# Patient Record
Sex: Female | Born: 1995 | Race: Black or African American | Hispanic: No | Marital: Single | Smoking: Former smoker
Health system: Southern US, Community
[De-identification: ages and names within clinical notes are randomized; demographics above are authoritative.]

## PROBLEM LIST (undated history)

## (undated) ENCOUNTER — Inpatient Hospital Stay: Payer: Self-pay

## (undated) DIAGNOSIS — Z91410 Personal history of adult physical and sexual abuse: Secondary | ICD-10-CM

## (undated) DIAGNOSIS — N83209 Unspecified ovarian cyst, unspecified side: Secondary | ICD-10-CM

## (undated) DIAGNOSIS — N390 Urinary tract infection, site not specified: Secondary | ICD-10-CM

## (undated) DIAGNOSIS — N200 Calculus of kidney: Secondary | ICD-10-CM

## (undated) DIAGNOSIS — O99810 Abnormal glucose complicating pregnancy: Secondary | ICD-10-CM

## (undated) DIAGNOSIS — O99019 Anemia complicating pregnancy, unspecified trimester: Secondary | ICD-10-CM

## (undated) DIAGNOSIS — K219 Gastro-esophageal reflux disease without esophagitis: Secondary | ICD-10-CM

## (undated) HISTORY — PX: OTHER SURGICAL HISTORY: SHX169

## (undated) HISTORY — DX: Unspecified ovarian cyst, unspecified side: N83.209

## (undated) HISTORY — DX: Gastro-esophageal reflux disease without esophagitis: K21.9

## (undated) HISTORY — DX: Calculus of kidney: N20.0

---
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
## 2016-02-05 DIAGNOSIS — Z91410 Personal history of adult physical and sexual abuse: Secondary | ICD-10-CM

## 2016-02-05 HISTORY — DX: Personal history of adult physical and sexual abuse: Z91.410

## 2016-04-29 ENCOUNTER — Emergency Department
Admission: EM | Admit: 2016-04-29 | Discharge: 2016-04-29 | Disposition: A | Discharge status: Home / Self Care | Payer: Medicaid Other | Attending: Emergency Medicine | Admitting: Emergency Medicine

## 2016-04-29 ENCOUNTER — Encounter: Payer: Self-pay | Admitting: Emergency Medicine

## 2016-04-29 DIAGNOSIS — F17210 Nicotine dependence, cigarettes, uncomplicated: Secondary | ICD-10-CM | POA: Insufficient documentation

## 2016-04-29 DIAGNOSIS — N926 Irregular menstruation, unspecified: Secondary | ICD-10-CM | POA: Diagnosis not present

## 2016-04-29 DIAGNOSIS — R21 Rash and other nonspecific skin eruption: Secondary | ICD-10-CM | POA: Diagnosis not present

## 2016-04-29 LAB — POCT PREGNANCY, URINE: Preg Test, Ur: NEGATIVE

## 2016-04-29 MED ORDER — HYDROCORTISONE 2.5 % EX OINT: TOPICAL_OINTMENT | Freq: Two times a day (BID) | CUTANEOUS | 0 refills | Status: DC

## 2016-04-29 MED ORDER — DIPHENHYDRAMINE HCL 25 MG PO CAPS: 25.0000 mg | ORAL_CAPSULE | ORAL | 0 refills | Status: DC | PRN

## 2016-04-29 NOTE — ED Triage Notes (Addendum)
Pt in via POV with complaints of rash to bilateral upper extremeties and chest x 2 weeks.  Pt denies any pain, reports itching.  Pt also would like to find out whether or not she is pregnant, states last period approximately one and half months ago.

## 2016-04-29 NOTE — ED Provider Notes (Signed)
[facility] Emergency Department Provider Note  ____________________________________________  Time seen: Approximately 4:29 PM  I have reviewed the triage vital signs and the nursing notes.   HISTORY  Chief Complaint Rash   HPI Christie Green is a 21 y.o. female who presents to the emergency department for evaluation of rash and possible pregnancy. Rash has been present for the past 2 weeks. She states it started on her chest and is now on her arms. No new contacts/personal hygiene products that she can think of. No one else with similar symptoms. She has not taken any medications for her symptoms. Her last menstrual cycle was about 6 weeks ago.  Past Medical History:  Diagnosis Date  . UTI (urinary tract infection) 09/2015    Patient Active Problem List   Diagnosis Date Noted  . Normal labor 11/30/2015  . Braxton Hick's contraction 11/29/2015  . Abdominal pain affecting pregnancy 11/29/2015  . Irregular contractions 11/15/2015  . UTI in pregnancy, antepartum, third trimester 11/05/2015  . Preterm uterine contractions 11/05/2015  . Pregnancy 10/30/2015    History reviewed. No pertinent surgical history.  Prior to Admission medications   Medication Sig Start Date End Date Taking? Authorizing Provider  diphenhydrAMINE (BENADRYL) 25 mg capsule Take 1 capsule (25 mg total) by mouth every 4 (four) hours as needed. 04/29/16 04/29/17  Chinita Pester, FNP  HYDROcodone-acetaminophen (NORCO) 5-325 MG tablet Take 1 tablet by mouth every 6 (six) hours as needed for moderate pain. 12/02/15   Conard Novak, MD  hydrocortisone 2.5 % ointment Apply topically 2 (two) times daily. 04/29/16   Chinita Pester, FNP  ibuprofen (ADVIL,MOTRIN) 600 MG tablet Take 1 tablet (600 mg total) by mouth every 6 (six) hours. 12/02/15   Conard Novak, MD  Prenatal Vit-Fe Fumarate-FA (PRENATAL VITAMIN PLUS LOW IRON) 27-1 MG TABS Take 1 tablet by mouth every morning. 05/17/15   Emily Filbert, MD    Allergies Patient has no known allergies.  No family history on file.  Social History Social History  Substance Use Topics  . Smoking status: Current Every Day Smoker    Packs/day: 1.00    Years: 4.00    Types: Cigarettes  . Smokeless tobacco: Never Used  . Alcohol use 8.4 oz/week    14 Cans of beer per week    Review of Systems  Constitutional: Negative for fever/chills Respiratory: Negative for shortness of breath. Musculoskeletal: Negative for pain. Skin: Positive for rash. Neurological: Negative for headaches, focal weakness or numbness. ____________________________________________   PHYSICAL EXAM:  VITAL SIGNS: ED Triage Vitals [04/29/16 1533]  Enc Vitals Group     BP (!) 140/45     Pulse Rate 80     Resp 16     Temp 98.2 F (36.8 C)     Temp Source Oral     SpO2 100 %     Weight 200 lb (90.7 kg)     Height  (1.626 m)     Head Circumference      Peak Flow      Pain Score      Pain Loc      Pain Edu?      Excl. in GC?      Constitutional: Alert and oriented. Well appearing and in no acute distress. Eyes: Conjunctivae are normal. EOMI. Nose: No congestion/rhinnorhea. Mouth/Throat: Mucous membranes are moist.   Neck: No stridor. Lymphatic: No cervical lymphadenopathy. Cardiovascular: Good peripheral circulation. Respiratory: Normal respiratory effort.  No  retractions. Lungs clear to auscultation. Musculoskeletal: FROM throughout. Neurologic:  Normal speech and language. No gross focal neurologic deficits are appreciated. Skin: Fine raised maculopapular rash on chest wall and bilateral forearms.  ____________________________________________   LABS (all labs ordered are listed, but only abnormal results are displayed)  Labs Reviewed  POC URINE PREG, ED  POCT PREGNANCY, URINE   ____________________________________________  EKG  Not indicated. ____________________________________________  RADIOLOGY  Not  indicated. ____________________________________________   PROCEDURES  Procedure(s) performed: None ____________________________________________   INITIAL IMPRESSION / ASSESSMENT AND PLAN / ED COURSE  21 year old female who presents to the emergency department for evaluation of rash and pregnancy test. She will be given a prescription for hydrocortisone cream, advised to take benadryl for itching, and follow up with the health department for menstrual irregularity.  Pertinent labs & imaging results that were available during my care of the patient were reviewed by me and considered in my medical decision making (see chart for details).   ____________________________________________   FINAL CLINICAL IMPRESSION(S) / ED DIAGNOSES  Final diagnoses:  Rash and nonspecific skin eruption  Menstrual irregularity    New Prescriptions   DIPHENHYDRAMINE (BENADRYL) 25 MG CAPSULE    Take 1 capsule (25 mg total) by mouth every 4 (four) hours as needed.   HYDROCORTISONE 2.5 % OINTMENT    Apply topically 2 (two) times daily.    If controlled substance prescribed during this visit, 12 month history viewed on the [facility] prior to issuing an initial prescription for Schedule II or III opiod.   Note:  This document was prepared using Dragon voice recognition software and may include unintentional dictation errors.    Chinita Pester, FNP 04/29/16 1645    Jeanmarie Plant, MD 04/29/16 2136

## 2016-07-09 ENCOUNTER — Emergency Department
Admission: EM | Admit: 2016-07-09 | Discharge: 2016-07-09 | Disposition: A | Discharge status: Home / Self Care | Payer: Medicaid Other | Attending: Emergency Medicine | Admitting: Emergency Medicine

## 2016-07-09 ENCOUNTER — Encounter: Payer: Self-pay | Admitting: Emergency Medicine

## 2016-07-09 ENCOUNTER — Emergency Department: Payer: Medicaid Other

## 2016-07-09 DIAGNOSIS — F17210 Nicotine dependence, cigarettes, uncomplicated: Secondary | ICD-10-CM | POA: Insufficient documentation

## 2016-07-09 DIAGNOSIS — Y9341 Activity, dancing: Secondary | ICD-10-CM | POA: Diagnosis not present

## 2016-07-09 DIAGNOSIS — X501XXA Overexertion from prolonged static or awkward postures, initial encounter: Secondary | ICD-10-CM | POA: Insufficient documentation

## 2016-07-09 DIAGNOSIS — Y999 Unspecified external cause status: Secondary | ICD-10-CM | POA: Diagnosis not present

## 2016-07-09 DIAGNOSIS — S93431A Sprain of tibiofibular ligament of right ankle, initial encounter: Secondary | ICD-10-CM | POA: Insufficient documentation

## 2016-07-09 DIAGNOSIS — Y929 Unspecified place or not applicable: Secondary | ICD-10-CM | POA: Insufficient documentation

## 2016-07-09 DIAGNOSIS — S99911A Unspecified injury of right ankle, initial encounter: Secondary | ICD-10-CM | POA: Diagnosis present

## 2016-07-09 DIAGNOSIS — S93491A Sprain of other ligament of right ankle, initial encounter: Secondary | ICD-10-CM

## 2016-07-09 LAB — POCT PREGNANCY, URINE: PREG TEST UR: NEGATIVE

## 2016-07-09 MED ORDER — IBUPROFEN 800 MG PO TABS: 800.0000 mg | ORAL_TABLET | Freq: Three times a day (TID) | ORAL | 0 refills | Status: DC | PRN

## 2016-07-09 MED ORDER — IBUPROFEN 800 MG PO TABS
800.0000 mg | ORAL_TABLET | Freq: Once | ORAL | Status: AC
  Administered 2016-07-09: 800 mg via ORAL
  Filled 2016-07-09: qty 1

## 2016-07-09 NOTE — ED Notes (Signed)

## 2016-07-09 NOTE — ED Notes (Addendum)
Pt states right ankle/foot began hurting 3 days ago-denies injury-but states pain went away. Pt states dancing tonight and falling, right foot hurting in the arch area since. Unable to bear weight, ROM decreased. Pedal pulses present and strong. Minimal swelling noted. Ice pack in place. Tender to touch, pt jumped back on stretcher with contact to check pulses.
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
## 2016-07-09 NOTE — ED Triage Notes (Signed)
Pt arrived to the ED for complaints of right ankle pain secondary to falling. Pt reports that it is painful to move the affected ankle. Pt is AOx4 in no apparent distress, with intact sensation, circulation and limited range of motion with mild swelling noticed. Pt appears to be under the influence of alcohol.

## 2017-06-22 ENCOUNTER — Encounter: Payer: Self-pay | Admitting: Emergency Medicine

## 2017-06-22 ENCOUNTER — Emergency Department: Payer: Self-pay

## 2017-06-22 ENCOUNTER — Emergency Department
Admission: EM | Admit: 2017-06-22 | Discharge: 2017-06-22 | Disposition: A | Discharge status: Home / Self Care | Payer: Self-pay | Attending: Emergency Medicine | Admitting: Emergency Medicine

## 2017-06-22 DIAGNOSIS — R1033 Periumbilical pain: Secondary | ICD-10-CM | POA: Insufficient documentation

## 2017-06-22 DIAGNOSIS — F17210 Nicotine dependence, cigarettes, uncomplicated: Secondary | ICD-10-CM | POA: Insufficient documentation

## 2017-06-22 LAB — URINALYSIS, COMPLETE (UACMP) WITH MICROSCOPIC
BILIRUBIN URINE: NEGATIVE
Bacteria, UA: NONE SEEN
GLUCOSE, UA: NEGATIVE mg/dL
KETONES UR: NEGATIVE mg/dL
LEUKOCYTES UA: NEGATIVE
Nitrite: NEGATIVE
PROTEIN: 30 mg/dL — AB
Specific Gravity, Urine: 1.029 (ref 1.005–1.030)
pH: 6 (ref 5.0–8.0)

## 2017-06-22 LAB — LIPASE, BLOOD: LIPASE: 29 U/L (ref 11–51)

## 2017-06-22 LAB — COMPREHENSIVE METABOLIC PANEL
ALK PHOS: 109 U/L (ref 38–126)
ALT: 24 U/L (ref 14–54)
AST: 21 U/L (ref 15–41)
Albumin: 4.2 g/dL (ref 3.5–5.0)
Anion gap: 6 (ref 5–15)
BUN: 14 mg/dL (ref 6–20)
CALCIUM: 8.9 mg/dL (ref 8.9–10.3)
CHLORIDE: 106 mmol/L (ref 101–111)
CO2: 24 mmol/L (ref 22–32)
Creatinine, Ser: 0.68 mg/dL (ref 0.44–1.00)
GFR calc non Af Amer: >60 mL/min (ref >60)
Glucose, Bld: 101 mg/dL — ABNORMAL HIGH (ref 65–99)
Potassium: 3.7 mmol/L (ref 3.5–5.1)
SODIUM: 136 mmol/L (ref 135–145)
Total Bilirubin: 0.6 mg/dL (ref 0.3–1.2)
Total Protein: 7.9 g/dL (ref 6.5–8.1)

## 2017-06-22 LAB — POCT PREGNANCY, URINE: Preg Test, Ur: NEGATIVE

## 2017-06-22 LAB — CBC
HCT: 39.4 % (ref 35.0–47.0)
HEMOGLOBIN: 13.2 g/dL (ref 12.0–16.0)
MCH: 30.4 pg (ref 26.0–34.0)
MCHC: 33.6 g/dL (ref 32.0–36.0)
MCV: 90.4 fL (ref 80.0–100.0)
Platelets: 219 10*3/uL (ref 150–440)
RBC: 4.36 MIL/uL (ref 3.80–5.20)
RDW: 14.4 % (ref 11.5–14.5)
WBC: 10.5 10*3/uL (ref 3.6–11.0)

## 2017-06-22 MED ORDER — DICYCLOMINE HCL 20 MG PO TABS: 20.0000 mg | ORAL_TABLET | Freq: Three times a day (TID) | ORAL | 0 refills | Status: DC | PRN

## 2017-06-22 NOTE — ED Notes (Signed)
Pt states last period was 3 months ago, states she does not have irregular periods. Did not elaborate any further

## 2017-06-22 NOTE — ED Triage Notes (Signed)
Pt c/o pain around belly button the started today, pt describes the pain as sharp 6/10.  Pt states the pain is intermittent. Pt denies any N/V/D.

## 2017-06-22 NOTE — ED Notes (Signed)
Pt c/o pain above belly button, pt states she has not had a period in 3 months.  Pt rates pain 6/10.

## 2017-06-22 NOTE — ED Provider Notes (Signed)
[facility] Emergency Department Provider Note       Time seen: ----------------------------------------- 6:46 PM on 06/22/2017 -----------------------------------------   I have reviewed the triage vital signs and the nursing notes.  HISTORY   Chief Complaint Abdominal Pain    HPI Christie Green is a 22 y.o. female with a history of UTI who presents to the ED for abdominal pain.  Patient states she has not had a.  In the past 3 months but currently is having some vaginal bleeding.  Patient describes the pain in the abdomen a 6 out of 10, denies fevers, chills, vomiting, diarrhea or constipation.  She is not sure if she could pregnant.  Past Medical History:  Diagnosis Date  . UTI (urinary tract infection) 09/2015    Patient Active Problem List   Diagnosis Date Noted  . Normal labor 11/30/2015  . Braxton Hick's contraction 11/29/2015  . Abdominal pain affecting pregnancy 11/29/2015  . Irregular contractions 11/15/2015  . UTI in pregnancy, antepartum, third trimester 11/05/2015  . Preterm uterine contractions 11/05/2015  . Pregnancy 10/30/2015    No past surgical history on file.  Allergies Patient has no known allergies.  Social History Social History   Tobacco Use  . Smoking status: Current Every Day Smoker    Packs/day: 1.00    Years: 4.00    Pack years: 4.00    Types: Cigarettes  . Smokeless tobacco: Never Used  Substance Use Topics  . Alcohol use: Yes    Alcohol/week: 8.4 oz    Types: 14 Cans of beer per week  . Drug use: No   Review of Systems Constitutional: Negative for fever. Cardiovascular: Negative for chest pain. Respiratory: Negative for shortness of breath. Gastrointestinal: Positive for abdominal pain Genitourinary: Negative for dysuria. Musculoskeletal: Negative for back pain. Skin: Negative for rash. Neurological: Negative for headaches, focal weakness or numbness.  All systems negative/normal/unremarkable  except as stated in the HPI  ____________________________________________   PHYSICAL EXAM:  VITAL SIGNS: ED Triage Vitals  Enc Vitals Group     BP 06/22/17 1743 139/63     Pulse Rate 06/22/17 1743 79     Resp 06/22/17 1743 18     Temp 06/22/17 1743 98.6 F (37 C)     Temp Source 06/22/17 1743 Oral     SpO2 06/22/17 1743 100 %     Weight 06/22/17 1744 200 lb (90.7 kg)     Height 06/22/17 1744 5\' 6"  (1.676 m)     Head Circumference --      Peak Flow --      Pain Score 06/22/17 1746 6     Pain Loc --      Pain Edu? --      Excl. in GC? --    Constitutional: Alert and oriented. Well appearing and in no distress. Eyes: Conjunctivae are normal. Normal extraocular movements. ENT   Head: Normocephalic and atraumatic.   Nose: No congestion/rhinnorhea.   Mouth/Throat: Mucous membranes are moist.   Neck: No stridor. Cardiovascular: Normal rate, regular rhythm. No murmurs, rubs, or gallops. Respiratory: Normal respiratory effort without tachypnea nor retractions. Breath sounds are clear and equal bilaterally. No wheezes/rales/rhonchi. Gastrointestinal: Soft and nontender. Normal bowel sounds Musculoskeletal: Nontender with normal range of motion in extremities. No lower extremity tenderness nor edema. Neurologic:  Normal speech and language. No gross focal neurologic deficits are appreciated.  Skin:  Skin is warm, dry and intact. No rash noted. Psychiatric: Mood and affect are normal. Speech and behavior  are normal.  ____________________________________________  ED COURSE:  As part of my medical decision making, I reviewed the following data within the electronic MEDICAL RECORD NUMBER History obtained from family if available, nursing notes, old chart and ekg, as well as notes from prior ED visits. Patient presented for abdominal pain, we will assess with labs and imaging as indicated at this time.   Procedures ____________________________________________   LABS  (pertinent positives/negatives)  Labs Reviewed  COMPREHENSIVE METABOLIC PANEL - Abnormal; Notable for the following components:      Result Value   Glucose, Bld 101 (*)    All other components within normal limits  URINALYSIS, COMPLETE (UACMP) WITH MICROSCOPIC - Abnormal; Notable for the following components:   Color, Urine YELLOW (*)    APPearance HAZY (*)    Hgb urine dipstick LARGE (*)    Protein, ur 30 (*)    RBC / HPF >50 (*)    All other components within normal limits  LIPASE, BLOOD  CBC  POC URINE PREG, ED  POCT PREGNANCY, URINE    RADIOLOGY  Abdomen 2 view Is unremarkable ____________________________________________  DIFFERENTIAL DIAGNOSIS   Dysmenorrhea, constipation, gas pain, muscle strain, GERD  FINAL ASSESSMENT AND PLAN  Abdominal pain   Plan: The patient had presented for abdominal pain. Patient's labs are unremarkable. Patient's imaging was unremarkable.  No clear etiology for her symptoms.  She is stable for outpatient follow-up.   Ulice Dash, MD   Note: This note was generated in part or whole with voice recognition software. Voice recognition is usually quite accurate but there are transcription errors that can and very often do occur. I apologize for any typographical errors that were not detected and corrected.     Emily Filbert, MD 06/22/17 [phone]

## 2017-07-09 ENCOUNTER — Emergency Department: Payer: Self-pay

## 2017-07-09 ENCOUNTER — Emergency Department
Admission: EM | Admit: 2017-07-09 | Discharge: 2017-07-09 | Disposition: A | Discharge status: Home / Self Care | Payer: Self-pay | Attending: Emergency Medicine | Admitting: Emergency Medicine

## 2017-07-09 ENCOUNTER — Encounter: Payer: Self-pay | Admitting: Emergency Medicine

## 2017-07-09 DIAGNOSIS — Y929 Unspecified place or not applicable: Secondary | ICD-10-CM | POA: Insufficient documentation

## 2017-07-09 DIAGNOSIS — Y939 Activity, unspecified: Secondary | ICD-10-CM | POA: Insufficient documentation

## 2017-07-09 DIAGNOSIS — F17210 Nicotine dependence, cigarettes, uncomplicated: Secondary | ICD-10-CM | POA: Insufficient documentation

## 2017-07-09 DIAGNOSIS — Z79899 Other long term (current) drug therapy: Secondary | ICD-10-CM | POA: Insufficient documentation

## 2017-07-09 DIAGNOSIS — S20212A Contusion of left front wall of thorax, initial encounter: Secondary | ICD-10-CM

## 2017-07-09 DIAGNOSIS — Y998 Other external cause status: Secondary | ICD-10-CM | POA: Insufficient documentation

## 2017-07-09 LAB — POCT PREGNANCY, URINE: Preg Test, Ur: NEGATIVE

## 2017-07-09 MED ORDER — IBUPROFEN 600 MG PO TABS
600.0000 mg | ORAL_TABLET | Freq: Once | ORAL | Status: AC
  Administered 2017-07-09: 600 mg via ORAL
  Filled 2017-07-09: qty 1

## 2017-07-09 MED ORDER — IBUPROFEN 600 MG PO TABS: 600.0000 mg | ORAL_TABLET | Freq: Three times a day (TID) | ORAL | 0 refills | Status: DC | PRN
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
## 2017-07-18 ENCOUNTER — Encounter: Payer: Self-pay | Admitting: Emergency Medicine

## 2017-07-18 ENCOUNTER — Emergency Department
Admission: EM | Admit: 2017-07-18 | Discharge: 2017-07-18 | Disposition: A | Discharge status: Home / Self Care | Payer: Self-pay | Attending: Emergency Medicine | Admitting: Emergency Medicine

## 2017-07-18 DIAGNOSIS — Y929 Unspecified place or not applicable: Secondary | ICD-10-CM | POA: Insufficient documentation

## 2017-07-18 DIAGNOSIS — F17210 Nicotine dependence, cigarettes, uncomplicated: Secondary | ICD-10-CM | POA: Insufficient documentation

## 2017-07-18 DIAGNOSIS — Y998 Other external cause status: Secondary | ICD-10-CM | POA: Insufficient documentation

## 2017-07-18 DIAGNOSIS — M795 Residual foreign body in soft tissue: Secondary | ICD-10-CM

## 2017-07-18 DIAGNOSIS — X58XXXA Exposure to other specified factors, initial encounter: Secondary | ICD-10-CM | POA: Insufficient documentation

## 2017-07-18 DIAGNOSIS — Y9389 Activity, other specified: Secondary | ICD-10-CM | POA: Insufficient documentation

## 2017-07-18 DIAGNOSIS — T180XXA Foreign body in mouth, initial encounter: Secondary | ICD-10-CM | POA: Insufficient documentation

## 2017-07-18 NOTE — ED Provider Notes (Signed)
[facility] Emergency Department Provider Note ____   First MD Initiated Contact with Patient 07/18/17 0157     (approximate)  I have reviewed the triage vital signs and the nursing notes.   HISTORY  Chief Complaint Oral Swelling    HPI Christie Green is a 22 y.o. female presents to the emergency department with request for tongue piercing to be removed.  Patient had her tongue pierced yesterday afternoon states that the tongue is painful and swollen and she is unable to eat and thus requesting tongue piercing to be removed.   Past Medical History:  Diagnosis Date  . UTI (urinary tract infection) 09/2015    Patient Active Problem List   Diagnosis Date Noted  . Normal labor 11/30/2015  . Braxton Hick's contraction 11/29/2015  . Abdominal pain affecting pregnancy 11/29/2015  . Irregular contractions 11/15/2015  . UTI in pregnancy, antepartum, third trimester 11/05/2015  . Preterm uterine contractions 11/05/2015  . Pregnancy 10/30/2015    History reviewed. No pertinent surgical history.  Prior to Admission medications   Medication Sig Start Date End Date Taking? Authorizing Provider  dicyclomine (BENTYL) 20 MG tablet Take 1 tablet (20 mg total) by mouth 3 (three) times daily as needed for spasms. 06/22/17   Emily Filbert, MD  diphenhydrAMINE (BENADRYL) 25 mg capsule Take 1 capsule (25 mg total) by mouth every 4 (four) hours as needed. 04/29/16 04/29/17  Triplett, Rulon Eisenmenger B, FNP  HYDROcodone-acetaminophen (NORCO) 5-325 MG tablet Take 1 tablet by mouth every 6 (six) hours as needed for moderate pain. 12/02/15   Conard Novak, MD  hydrocortisone 2.5 % ointment Apply topically 2 (two) times daily. 04/29/16   Triplett, Rulon Eisenmenger B, FNP  ibuprofen (ADVIL,MOTRIN) 600 MG tablet Take 1 tablet (600 mg total) by mouth every 8 (eight) hours as needed. 07/09/17   Merrily Brittle, MD  Prenatal Vit-Fe Fumarate-FA (PRENATAL VITAMIN PLUS LOW IRON) 27-1 MG TABS Take 1  tablet by mouth every morning. 05/17/15   Emily Filbert, MD    Allergies No known drug allergies No family history on file.  Social History Social History   Tobacco Use  . Smoking status: Current Every Day Smoker    Packs/day: 1.00    Years: 4.00    Pack years: 4.00    Types: Cigarettes  . Smokeless tobacco: Never Used  Substance Use Topics  . Alcohol use: Yes    Alcohol/week: 8.4 oz    Types: 14 Cans of beer per week  . Drug use: No    Review of Systems Constitutional: No fever/chills Eyes: No visual changes. ENT: No sore throat. Cardiovascular: Denies chest pain. Respiratory: Denies shortness of breath. Gastrointestinal: No abdominal pain.  No nausea, no vomiting.  No diarrhea.  No constipation. Genitourinary: Negative for dysuria. Musculoskeletal: Negative for neck pain.  Negative for back pain. Integumentary: Negative for rash. Neurological: Negative for headaches, focal weakness or numbness.   ____________________________________________   PHYSICAL EXAM:  VITAL SIGNS: ED Triage Vitals  Enc Vitals Group     BP 07/18/17 0105 118/87     Pulse Rate 07/18/17 0105 77     Resp 07/18/17 0105 16     Temp 07/18/17 0105 98.5 F (36.9 C)     Temp Source 07/18/17 0105 Axillary     SpO2 07/18/17 0105 100 %     Weight 07/18/17 0101 90.7 kg (200 lb)     Height 07/18/17 0101 1.702 m (5\' 7" )     Head Circumference --  Peak Flow --      Pain Score 07/18/17 0100 10     Pain Loc --      Pain Edu? --      Excl. in GC? --     Constitutional: Alert and oriented. Well appearing and in no acute distress. Eyes: Conjunctivae are normal. Head: Atraumatic. Mouth/Throat: Mucous membranes are moist.  Oropharynx non-erythematous.  Tongue piercing with no signs of infection. Neck: No stridor.   Skin:  Skin is warm, dry and intact. No rash noted. Psychiatric: Mood and affect are normal. Speech and behavior are  normal.    Procedures   ____________________________________________   INITIAL IMPRESSION / ASSESSMENT AND PLAN / ED COURSE  As part of my medical decision making, I reviewed the following data within the electronic MEDICAL RECORD NUMBER   22 year old female presents emergency department requesting her tongue ring to be removed.  No signs of infection no signs of Ludwig's angina.  Tongue was removed manually.  Patient tolerated it well and was discharged home.  Patient advised of potential warning signs of infection. ____________________________________________  FINAL CLINICAL IMPRESSION(S) / ED DIAGNOSES  Final diagnoses:  Foreign body (FB) in soft tissue     MEDICATIONS GIVEN DURING THIS VISIT:  Medications - No data to display   ED Discharge Orders    None       Note:  This document was prepared using Dragon voice recognition software and may include unintentional dictation errors.    Darci CurrentBrown, [place] N, MD 07/18/17 [phone]

## 2017-07-18 NOTE — ED Triage Notes (Signed)
Pt reports got her tongue pierced this evening and now it is swollen, painful and she can not get it out.

## 2017-09-02 ENCOUNTER — Emergency Department
Admission: EM | Admit: 2017-09-02 | Discharge: 2017-09-02 | Disposition: A | Discharge status: Home / Self Care | Payer: Medicaid Other | Attending: Emergency Medicine | Admitting: Emergency Medicine

## 2017-09-02 ENCOUNTER — Encounter: Payer: Self-pay | Admitting: Emergency Medicine

## 2017-09-02 ENCOUNTER — Other Ambulatory Visit: Payer: Self-pay

## 2017-09-02 DIAGNOSIS — S80862A Insect bite (nonvenomous), left lower leg, initial encounter: Secondary | ICD-10-CM | POA: Diagnosis present

## 2017-09-02 DIAGNOSIS — Z79899 Other long term (current) drug therapy: Secondary | ICD-10-CM | POA: Insufficient documentation

## 2017-09-02 DIAGNOSIS — Y999 Unspecified external cause status: Secondary | ICD-10-CM | POA: Insufficient documentation

## 2017-09-02 DIAGNOSIS — F17210 Nicotine dependence, cigarettes, uncomplicated: Secondary | ICD-10-CM | POA: Diagnosis not present

## 2017-09-02 DIAGNOSIS — Y929 Unspecified place or not applicable: Secondary | ICD-10-CM | POA: Diagnosis not present

## 2017-09-02 DIAGNOSIS — Y939 Activity, unspecified: Secondary | ICD-10-CM | POA: Diagnosis not present

## 2017-09-02 DIAGNOSIS — W57XXXA Bitten or stung by nonvenomous insect and other nonvenomous arthropods, initial encounter: Secondary | ICD-10-CM | POA: Insufficient documentation

## 2017-09-02 MED ORDER — HYDROCORTISONE 2.5 % EX OINT: TOPICAL_OINTMENT | Freq: Two times a day (BID) | CUTANEOUS | 0 refills | Status: DC

## 2017-09-02 NOTE — ED Triage Notes (Signed)
Pt in via POV with possible insect bite to left inner knee, pt reports pain, swelling, itching to area.  Pt ambulatory to triage, NAD noted at this time.

## 2017-09-02 NOTE — ED Provider Notes (Signed)
[facility] Emergency Department Provider Note  ____________________________________________  Time seen: Approximately 4:40 PM  I have reviewed the triage vital signs and the nursing notes.   HISTORY  Chief Complaint Insect Bite   HPI Christie Green is a 22 y.o. female who presents to the emergency department for treatment and evaluation of pruritic and red area on the left leg. Symptoms started last night. She didn't feel anything bite her. No shortness of breath, cough, or wheeze.    Past Medical History:  Diagnosis Date  . UTI (urinary tract infection) 09/2015    Patient Active Problem List   Diagnosis Date Noted  . Normal labor 11/30/2015  . Braxton Hick's contraction 11/29/2015  . Abdominal pain affecting pregnancy 11/29/2015  . Irregular contractions 11/15/2015  . UTI in pregnancy, antepartum, third trimester 11/05/2015  . Preterm uterine contractions 11/05/2015  . Pregnancy 10/30/2015    History reviewed. No pertinent surgical history.  Prior to Admission medications   Medication Sig Start Date End Date Taking? Authorizing Provider  dicyclomine (BENTYL) 20 MG tablet Take 1 tablet (20 mg total) by mouth 3 (three) times daily as needed for spasms. 06/22/17   Emily Filbert, MD  diphenhydrAMINE (BENADRYL) 25 mg capsule Take 1 capsule (25 mg total) by mouth every 4 (four) hours as needed. 04/29/16 04/29/17  Tori Dattilio, Rulon Eisenmenger B, FNP  HYDROcodone-acetaminophen (NORCO) 5-325 MG tablet Take 1 tablet by mouth every 6 (six) hours as needed for moderate pain. 12/02/15   Conard Novak, MD  hydrocortisone 2.5 % ointment Apply topically 2 (two) times daily. 09/02/17   Theoplis Garciagarcia, Rulon Eisenmenger B, FNP  ibuprofen (ADVIL,MOTRIN) 600 MG tablet Take 1 tablet (600 mg total) by mouth every 8 (eight) hours as needed. 07/09/17   Merrily Brittle, MD  Prenatal Vit-Fe Fumarate-FA (PRENATAL VITAMIN PLUS LOW IRON) 27-1 MG TABS Take 1 tablet by mouth every morning. 05/17/15   Emily Filbert, MD    Allergies Patient has no known allergies.  No family history on file.  Social History Social History   Tobacco Use  . Smoking status: Current Every Day Smoker    Packs/day: 0.50    Years: 4.00    Pack years: 2.00    Types: Cigarettes  . Smokeless tobacco: Never Used  Substance Use Topics  . Alcohol use: Yes    Alcohol/week: 14.0 standard drinks    Types: 14 Cans of beer per week  . Drug use: No    Review of Systems  Constitutional: Negative for fever. Respiratory: Negative for cough or shortness of breath.  Musculoskeletal: Negative for myalgias Skin: Positive for erythematous area on the posterior calf and knee. Neurological: Negative for numbness or paresthesias. ____________________________________________   PHYSICAL EXAM:  VITAL SIGNS: ED Triage Vitals  Enc Vitals Group     BP 09/02/17 1510 (!) 128/48     Pulse Rate 09/02/17 1510 72     Resp 09/02/17 1510 16     Temp 09/02/17 1510 99.1 F (37.3 C)     Temp Source 09/02/17 1510 Oral     SpO2 09/02/17 1510 100 %     Weight 09/02/17 1511 200 lb (90.7 kg)     Height 09/02/17 1511 5\' 8"  (1.727 m)     Head Circumference --      Peak Flow --      Pain Score 09/02/17 1511 0     Pain Loc --      Pain Edu? --      Excl.  in GC? --      Constitutional: Well appearing. Eyes: Conjunctivae are clear without discharge or drainage. Nose: No rhinorrhea noted. Mouth/Throat: Airway is patent.  Neck: No stridor. Unrestricted range of motion observed. Cardiovascular: Capillary refill is <3 seconds.  Respiratory: Respirations are even and unlabored.. Musculoskeletal: Unrestricted range of motion observed. Neurologic: Awake, alert, and oriented x 4.  Skin: Erythematous, warm area on the left posterior calf and knee.  ____________________________________________   LABS (all labs ordered are listed, but only abnormal results are displayed)  Labs Reviewed - No data to  display ____________________________________________  EKG  Not indicated. ____________________________________________  RADIOLOGY  Not indicated. ____________________________________________   PROCEDURES  Procedures ____________________________________________   INITIAL IMPRESSION / ASSESSMENT AND PLAN / ED COURSE  Christie Green is a 22 y.o. female who presents to the emergency department for treatment and evaluation of pruritic lesion on the left leg. Exam most consistent with insect bite. She will be treated with hydrocortisone cream and advised to return to the ER or see her PCP for symptoms that do not improve over the next few days.   Medications - No data to display   Pertinent labs & imaging results that were available during my care of the patient were reviewed by me and considered in my medical decision making (see chart for details).  ____________________________________________   FINAL CLINICAL IMPRESSION(S) / ED DIAGNOSES  Final diagnoses:  Insect bite of left lower leg, initial encounter    ED Discharge Orders         Ordered    hydrocortisone 2.5 % ointment  2 times daily     09/02/17 1650           Note:  This document was prepared using Dragon voice recognition software and may include unintentional dictation errors.    Chinita Pesterriplett, Cayleen Benjamin B, FNP 09/02/17 1652    Merrily Brittleifenbark, Neil, MD 09/03/17 2143

## 2017-09-02 NOTE — ED Notes (Signed)
See triage note  Present with possible insect bite to left leg  Redness and swelling noted to posterior knee and into lower leg  No fever

## 2017-10-04 ENCOUNTER — Encounter: Payer: Self-pay | Admitting: Emergency Medicine

## 2017-10-04 ENCOUNTER — Emergency Department: Payer: Medicaid Other

## 2017-10-04 ENCOUNTER — Other Ambulatory Visit: Payer: Self-pay

## 2017-10-04 ENCOUNTER — Emergency Department
Admission: EM | Admit: 2017-10-04 | Discharge: 2017-10-04 | Disposition: A | Discharge status: Home / Self Care | Payer: Medicaid Other | Attending: Emergency Medicine | Admitting: Emergency Medicine

## 2017-10-04 DIAGNOSIS — J020 Streptococcal pharyngitis: Secondary | ICD-10-CM

## 2017-10-04 DIAGNOSIS — J029 Acute pharyngitis, unspecified: Secondary | ICD-10-CM

## 2017-10-04 DIAGNOSIS — J039 Acute tonsillitis, unspecified: Secondary | ICD-10-CM

## 2017-10-04 DIAGNOSIS — Z331 Pregnant state, incidental: Secondary | ICD-10-CM | POA: Diagnosis not present

## 2017-10-04 DIAGNOSIS — Z3490 Encounter for supervision of normal pregnancy, unspecified, unspecified trimester: Secondary | ICD-10-CM

## 2017-10-04 DIAGNOSIS — F17210 Nicotine dependence, cigarettes, uncomplicated: Secondary | ICD-10-CM | POA: Insufficient documentation

## 2017-10-04 DIAGNOSIS — Z79899 Other long term (current) drug therapy: Secondary | ICD-10-CM | POA: Diagnosis not present

## 2017-10-04 LAB — CBC WITH DIFFERENTIAL/PLATELET
BASOS ABS: 0 10*3/uL (ref 0–0.1)
BASOS PCT: 0 %
Eosinophils Absolute: 0.1 10*3/uL (ref 0–0.7)
Eosinophils Relative: 1 %
HCT: 38.6 % (ref 35.0–47.0)
Hemoglobin: 13 g/dL (ref 12.0–16.0)
Lymphocytes Relative: 18 %
Lymphs Abs: 3 10*3/uL (ref 1.0–3.6)
MCH: 30.6 pg (ref 26.0–34.0)
MCHC: 33.6 g/dL (ref 32.0–36.0)
MCV: 91.1 fL (ref 80.0–100.0)
MONO ABS: 1.1 10*3/uL — AB (ref 0.2–0.9)
MONOS PCT: 7 %
Neutro Abs: 12.7 10*3/uL — ABNORMAL HIGH (ref 1.4–6.5)
Neutrophils Relative %: 74 %
Platelets: 232 10*3/uL (ref 150–440)
RBC: 4.23 MIL/uL (ref 3.80–5.20)
RDW: 14.9 % — AB (ref 11.5–14.5)
WBC: 16.9 10*3/uL — ABNORMAL HIGH (ref 3.6–11.0)

## 2017-10-04 LAB — HCG, QUANTITATIVE, PREGNANCY: HCG, BETA CHAIN, QUANT, S: 105911 m[IU]/mL — AB (ref <5)

## 2017-10-04 LAB — OB RESULTS CONSOLE VARICELLA ZOSTER ANTIBODY, IGG: Varicella: IMMUNE

## 2017-10-04 LAB — BASIC METABOLIC PANEL
ANION GAP: 11 (ref 5–15)
BUN: 6 mg/dL (ref 6–20)
CALCIUM: 9.4 mg/dL (ref 8.9–10.3)
CO2: 23 mmol/L (ref 22–32)
CREATININE: 0.53 mg/dL (ref 0.44–1.00)
Chloride: 102 mmol/L (ref 98–111)
Glucose, Bld: 86 mg/dL (ref 70–99)
Potassium: 3.5 mmol/L (ref 3.5–5.1)
Sodium: 136 mmol/L (ref 135–145)

## 2017-10-04 LAB — GROUP A STREP BY PCR: Group A Strep by PCR: DETECTED — AB

## 2017-10-04 LAB — OB RESULTS CONSOLE RUBELLA ANTIBODY, IGM: Rubella: IMMUNE

## 2017-10-04 MED ORDER — HYDROCODONE-ACETAMINOPHEN 7.5-325 MG/15ML PO SOLN
10.0000 mL | Freq: Once | ORAL | Status: AC
  Administered 2017-10-04: 10 mL via ORAL
  Filled 2017-10-04: qty 15

## 2017-10-04 MED ORDER — AMPICILLIN-SULBACTAM SODIUM 3 (2-1) G IJ SOLR
3.0000 g | Freq: Once | INTRAMUSCULAR | Status: AC
  Administered 2017-10-04: 3 g via INTRAVENOUS
  Filled 2017-10-04: qty 3

## 2017-10-04 MED ORDER — ONDANSETRON 4 MG PO TBDP: 4.0000 mg | ORAL_TABLET | Freq: Three times a day (TID) | ORAL | 0 refills | Status: DC | PRN

## 2017-10-04 MED ORDER — IOHEXOL 300 MG/ML  SOLN
75.0000 mL | Freq: Once | INTRAMUSCULAR | Status: AC | PRN
  Administered 2017-10-04: 75 mL via INTRAVENOUS

## 2017-10-04 MED ORDER — AMOXICILLIN 500 MG PO CAPS: 500.0000 mg | ORAL_CAPSULE | Freq: Three times a day (TID) | ORAL | 0 refills | Status: DC

## 2017-10-04 MED ORDER — DEXAMETHASONE SODIUM PHOSPHATE 10 MG/ML IJ SOLN
10.0000 mg | Freq: Once | INTRAMUSCULAR | Status: AC
  Administered 2017-10-04: 10 mg via INTRAVENOUS
  Filled 2017-10-04: qty 1

## 2017-10-04 MED ORDER — SODIUM CHLORIDE 0.9 % IV BOLUS
1000.0000 mL | Freq: Once | INTRAVENOUS | Status: AC
  Administered 2017-10-04: 1000 mL via INTRAVENOUS

## 2017-10-04 MED ORDER — HYDROCODONE-ACETAMINOPHEN 7.5-325 MG/15ML PO SOLN: 10.0000 mL | Freq: Four times a day (QID) | ORAL | 0 refills | Status: DC | PRN

## 2017-10-04 NOTE — ED Provider Notes (Signed)
[facility] Emergency Department Provider Note   ____________________________________________   First MD Initiated Contact with Patient 10/04/17 0225     (approximate)  I have reviewed the triage vital signs and the nursing notes.   HISTORY  Chief Complaint Sore Throat and Otalgia    HPI Christie Green is a 22 y.o. female who presents to the ED from home via EMS with a chief complaint of sore throat and ear pain.  Patient reports sore throat x1 week.  Has also had mild dry cough.  Tonight pain radiated to her left ear.  Denies associated fever, chills, chest pain, shortness of breath, abdominal pain, nausea or vomiting.  Denies recent travel or trauma. + sick contacts.   Past Medical History:  Diagnosis Date  . UTI (urinary tract infection) 09/2015    Patient Active Problem List   Diagnosis Date Noted  . Normal labor 11/30/2015  . Braxton Hick's contraction 11/29/2015  . Abdominal pain affecting pregnancy 11/29/2015  . Irregular contractions 11/15/2015  . UTI in pregnancy, antepartum, third trimester 11/05/2015  . Preterm uterine contractions 11/05/2015  . Pregnancy 10/30/2015    History reviewed. No pertinent surgical history.  Prior to Admission medications   Medication Sig Start Date End Date Taking? Authorizing Provider  amoxicillin (AMOXIL) 500 MG capsule Take 1 capsule (500 mg total) by mouth 3 (three) times daily. 10/04/17   Irean Hong, MD  dicyclomine (BENTYL) 20 MG tablet Take 1 tablet (20 mg total) by mouth 3 (three) times daily as needed for spasms. 06/22/17   Emily Filbert, MD  diphenhydrAMINE (BENADRYL) 25 mg capsule Take 1 capsule (25 mg total) by mouth every 4 (four) hours as needed. 04/29/16 04/29/17  Triplett, Rulon Eisenmenger B, FNP  HYDROcodone-acetaminophen (HYCET) 7.5-325 mg/15 ml solution Take 10 mLs by mouth every 6 (six) hours as needed for moderate pain. 10/04/17 10/04/18  Irean Hong, MD  hydrocortisone 2.5 % ointment Apply  topically 2 (two) times daily. 09/02/17   Triplett, Rulon Eisenmenger B, FNP  ibuprofen (ADVIL,MOTRIN) 600 MG tablet Take 1 tablet (600 mg total) by mouth every 8 (eight) hours as needed. 07/09/17   Merrily Brittle, MD  ondansetron (ZOFRAN ODT) 4 MG disintegrating tablet Take 1 tablet (4 mg total) by mouth every 8 (eight) hours as needed for nausea or vomiting. 10/04/17   Irean Hong, MD  Prenatal Vit-Fe Fumarate-FA (PRENATAL VITAMIN PLUS LOW IRON) 27-1 MG TABS Take 1 tablet by mouth every morning. 05/17/15   Emily Filbert, MD    Allergies Patient has no known allergies.  No family history on file.  Social History Social History   Tobacco Use  . Smoking status: Current Every Day Smoker    Packs/day: 0.50    Years: 4.00    Pack years: 2.00    Types: Cigarettes  . Smokeless tobacco: Never Used  Substance Use Topics  . Alcohol use: Yes    Alcohol/week: 14.0 standard drinks    Types: 14 Cans of beer per week  . Drug use: No    Review of Systems  Constitutional: No fever/chills Eyes: No visual changes. ENT: Positive for sore throat and ear pain. Cardiovascular: Denies chest pain. Respiratory: Positive for nonproductive cough.  Denies shortness of breath. Gastrointestinal: No abdominal pain.  No nausea, no vomiting.  No diarrhea.  No constipation. Genitourinary: Negative for dysuria. Musculoskeletal: Negative for back pain. Skin: Negative for rash. Neurological: Negative for headaches, focal weakness or numbness.   ____________________________________________   PHYSICAL  EXAM:  VITAL SIGNS: ED Triage Vitals  Enc Vitals Group     BP 10/04/17 0013 (!) 111/54     Pulse Rate 10/04/17 0013 70     Resp 10/04/17 0013 20     Temp 10/04/17 0013 98.9 F (37.2 C)     Temp Source 10/04/17 0013 Oral     SpO2 10/04/17 0013 100 %     Weight 10/04/17 0014 200 lb (90.7 kg)     Height 10/04/17 0014 5\' 2"  (1.575 m)     Head Circumference --      Peak Flow --      Pain Score 10/04/17 0013 10       Pain Loc --      Pain Edu? --      Excl. in GC? --     Constitutional: Alert and oriented. Well appearing and in mild acute distress. Eyes: Conjunctivae are normal. PERRL. EOMI. Head: Atraumatic. Ears: Bilateral TM dullness; fluid behind left ear. Nose: Congestion/rhinnorhea. Mouth/Throat: Mucous membranes are moist.  Oropharynx moderately erythematous.  Question moderate fullness of left tonsil.  No tonsillar exudates. Mildly muffled voice.  No drooling. Neck: No stridor.  Supple neck without meningismus. Hematological/Lymphatic/Immunilogical:Shotty left anterior cervical lymphadenopathy. Cardiovascular: Normal rate, regular rhythm. Grossly normal heart sounds.  Good peripheral circulation. Respiratory: Normal respiratory effort.  No retractions. Lungs CTAB. Gastrointestinal: Obese.  Soft and nontender. No distention. No abdominal bruits. No CVA tenderness. Musculoskeletal: No lower extremity tenderness nor edema.  No joint effusions. Neurologic:  Normal speech and language. No gross focal neurologic deficits are appreciated. No gait instability. Skin:  Skin is warm, dry and intact. No rash noted.  No petechiae. Psychiatric: Mood and affect are normal. Speech and behavior are normal.  ____________________________________________   LABS (all labs ordered are listed, but only abnormal results are displayed)  Labs Reviewed  GROUP A STREP BY PCR - Abnormal; Notable for the following components:      Result Value   Group A Strep by PCR DETECTED (*)    All other components within normal limits  CBC WITH DIFFERENTIAL/PLATELET - Abnormal; Notable for the following components:   WBC 16.9 (*)    RDW 14.9 (*)    Neutro Abs 12.7 (*)    Monocytes Absolute 1.1 (*)    All other components within normal limits  HCG, QUANTITATIVE, PREGNANCY - Abnormal; Notable for the following components:   hCG, Beta Chain, Quant, S 105,911 (*)    All other components within normal limits  CULTURE,  BLOOD (ROUTINE X 2)  CULTURE, BLOOD (ROUTINE X 2)  BASIC METABOLIC PANEL  POC URINE PREG, ED   ____________________________________________  EKG  None ____________________________________________  RADIOLOGY  ED MD interpretation: Acute left tonsillitis without abscess; patent airway  Official radiology report(s): Ct Soft Tissue Neck W Contrast  Result Date: 10/04/2017 CLINICAL DATA:  Sore throat for 1 week, RIGHT ear pain today. Assess for LEFT peritonsillar abscess. Pregnant. EXAM: CT NECK WITH CONTRAST TECHNIQUE: Multidetector CT imaging of the neck was performed using the standard protocol following the bolus administration of intravenous contrast. CONTRAST:  75mL OMNIPAQUE IOHEXOL 300 MG/ML  SOLN COMPARISON:  None. FINDINGS: PHARYNX AND LARYNX: Fullness of the adenoidal soft tissues in keeping with patient's provided young age. Enlarged LEFT palatine tonsillolith striated enhancement and edema. Edematous uvula. No focal fluid collection. Mild LEFT parapharyngeal fat stranding with trace effusion. Normal epiglottis. Normal larynx. SALIVARY GLANDS: Normal. THYROID: Normal. LYMPH NODES: Reniform 16 mm RIGHT level IIa lymph node  with additional prominent reniform lymph nodes with homogeneous enhancement, no pericapsular inflammation. VASCULAR: Normal. LIMITED INTRACRANIAL: Normal. VISUALIZED ORBITS: Normal. MASTOIDS AND VISUALIZED PARANASAL SINUSES: Mild paranasal sinus mucosal thickening, chronic bony remodeling RIGHT greater than LEFT maxillary sinuses. Mastoid air cells are well aerated. SKELETON: Nonacute. Scattered dental caries. Tooth 3 periapical abscess. UPPER CHEST: Lung apices are clear. No superior mediastinal lymphadenopathy. OTHER: None. IMPRESSION: 1. Acute LEFT tonsillitis without abscess.  Patent airway. 2. Mild reactive lymphadenopathy. 3. Tooth 3 periapical abscess.  Scattered dental caries. Electronically Signed   By: Awilda Metro M.D.   On: 10/04/2017 06:26     ____________________________________________   PROCEDURES  Procedure(s) performed:   Bedside ultrasound demonstrates IUP; fetal heart rate approximately 140s  Procedures  Critical Care performed: No  ____________________________________________   INITIAL IMPRESSION / ASSESSMENT AND PLAN / ED COURSE  As part of my medical decision making, I reviewed the following data within the electronic MEDICAL RECORD NUMBER Nursing notes reviewed and incorporated, Labs reviewed, Radiograph reviewed and Notes from prior ED visits   22 year old female who presents with a one-week history of sore throat and left ear pain onset tonight.  Slight fullness to left tonsil concerning for peritonsillar abscess.  Will initiate IV fluid resuscitation, 10 mg IV Decadron, 3 g IV Unasyn.  Will obtain basic lab work and CT soft tissue neck to further delineate.  Clinical Course as of Oct 05 650  Sat Oct 04, 2017  0515 Updated patient of positive strep results as well as elevated beta hCG.  This would make patient G2, P1.  We discussed risk/benefits of proceeding with CT scan of her neck.  Patient is agreeable to proceed with CT scan.  States her sore throat is completely gone.   [JS]  [id number] Updated patient of CT imaging result.  Will discharge home on Amoxicillin and Lortab elixir as needed.  She will follow-up with health department for her pregnancy and ENT as needed for tonsillitis.  Strict return precautions given.  Patient verbalizes understanding and agrees with plan of care.   [JS]    Clinical Course User Index [JS] Irean Hong, MD     ____________________________________________   FINAL CLINICAL IMPRESSION(S) / ED DIAGNOSES  Final diagnoses:  Strep throat  Tonsillitis  Pregnancy, unspecified gestational age  Sore throat     ED Discharge Orders         Ordered    amoxicillin (AMOXIL) 500 MG capsule  3 times daily     10/04/17 0648    HYDROcodone-acetaminophen (HYCET) 7.5-325 mg/15 ml  solution  Every 6 hours PRN     10/04/17 0648    ondansetron (ZOFRAN ODT) 4 MG disintegrating tablet  Every 8 hours PRN     10/04/17 3244           Note:  This document was prepared using Dragon voice recognition software and may include unintentional dictation errors.    Irean Hong, MD 10/04/17 [phone]

## 2017-10-04 NOTE — Discharge Instructions (Signed)
1.  Take antibiotic as prescribed (amoxicillin 500 mg 3 times daily x7 days). 2.  You may take Tylenol as needed for throat pain; Lortab elixir only as needed for more severe throat pain.  Do not take Ibuprofen as this is not safe for the baby. 3.  Your blood pregnancy level or beta hCG tonight is 105,911. 4.  Drink plenty of fluids daily. 5.  You may take Zofran as needed for nausea or vomiting. 6.  Return to the ER for worsening symptoms, persistent vomiting, difficulty breathing or other concerns.

## 2017-10-04 NOTE — ED Triage Notes (Signed)
Pt presents to ER from home via [facility] with complains of sore throat for a wk and right ear pain today

## 2017-10-09 LAB — CULTURE, BLOOD (ROUTINE X 2)
CULTURE: NO GROWTH
Culture: NO GROWTH

## 2017-11-05 ENCOUNTER — Emergency Department
Admission: EM | Admit: 2017-11-05 | Discharge: 2017-11-06 | Disposition: A | Discharge status: Home / Self Care | Payer: Medicaid Other | Attending: Emergency Medicine | Admitting: Emergency Medicine

## 2017-11-05 DIAGNOSIS — Z79899 Other long term (current) drug therapy: Secondary | ICD-10-CM | POA: Insufficient documentation

## 2017-11-05 DIAGNOSIS — Z87891 Personal history of nicotine dependence: Secondary | ICD-10-CM | POA: Insufficient documentation

## 2017-11-05 DIAGNOSIS — O2340 Unspecified infection of urinary tract in pregnancy, unspecified trimester: Secondary | ICD-10-CM

## 2017-11-05 DIAGNOSIS — O2341 Unspecified infection of urinary tract in pregnancy, first trimester: Secondary | ICD-10-CM | POA: Insufficient documentation

## 2017-11-05 DIAGNOSIS — Z3A09 9 weeks gestation of pregnancy: Secondary | ICD-10-CM | POA: Insufficient documentation

## 2017-11-05 DIAGNOSIS — R109 Unspecified abdominal pain: Secondary | ICD-10-CM | POA: Diagnosis present

## 2017-11-05 DIAGNOSIS — Z3490 Encounter for supervision of normal pregnancy, unspecified, unspecified trimester: Secondary | ICD-10-CM

## 2017-11-05 LAB — CBC
HEMATOCRIT: 39.6 % (ref 36.0–46.0)
HEMOGLOBIN: 12.3 g/dL (ref 12.0–15.0)
MCH: 29.5 pg (ref 26.0–34.0)
MCHC: 31.1 g/dL (ref 30.0–36.0)
MCV: 95 fL (ref 80.0–100.0)
Platelets: 220 10*3/uL (ref 150–400)
RBC: 4.17 MIL/uL (ref 3.87–5.11)
RDW: 14.3 % (ref 11.5–15.5)
WBC: 9.7 10*3/uL (ref 4.0–10.5)
nRBC: 0 % (ref 0.0–0.2)

## 2017-11-05 LAB — BASIC METABOLIC PANEL
Anion gap: 6 (ref 5–15)
BUN: 6 mg/dL (ref 6–20)
CHLORIDE: 108 mmol/L (ref 98–111)
CO2: 23 mmol/L (ref 22–32)
CREATININE: 0.53 mg/dL (ref 0.44–1.00)
Calcium: 8.9 mg/dL (ref 8.9–10.3)
GFR calc Af Amer: >60 mL/min (ref >60)
GLUCOSE: 104 mg/dL — AB (ref 70–99)
POTASSIUM: 3.5 mmol/L (ref 3.5–5.1)
Sodium: 137 mmol/L (ref 135–145)

## 2017-11-05 LAB — POCT PREGNANCY, URINE: Preg Test, Ur: POSITIVE — AB

## 2017-11-05 NOTE — ED Triage Notes (Signed)
Patient c/o right flank pain radiating to abdomen. Patient reports pain began today. Patient is 9-[redacted] weeks pregnant.

## 2017-11-05 NOTE — ED Triage Notes (Signed)
Pt in via EMS from home with c/o pain to right flank. EMS reports pt is 9-[redacted] weeks pregnant and just found out so has not received care for it. HR 80's, 100%RA, 180/100.

## 2017-11-06 LAB — URINALYSIS, COMPLETE (UACMP) WITH MICROSCOPIC
BILIRUBIN URINE: NEGATIVE
Glucose, UA: NEGATIVE mg/dL
HGB URINE DIPSTICK: NEGATIVE
Ketones, ur: 20 mg/dL — AB
NITRITE: NEGATIVE
Protein, ur: 30 mg/dL — AB
SPECIFIC GRAVITY, URINE: 1.026 (ref 1.005–1.030)
pH: 5 (ref 5.0–8.0)

## 2017-11-06 LAB — HCG, QUANTITATIVE, PREGNANCY: HCG, BETA CHAIN, QUANT, S: 162872 m[IU]/mL — AB (ref <5)

## 2017-11-06 MED ORDER — CEPHALEXIN 500 MG PO CAPS: 500.0000 mg | ORAL_CAPSULE | Freq: Two times a day (BID) | ORAL | 0 refills | Status: DC

## 2017-11-06 MED ORDER — CEPHALEXIN 500 MG PO CAPS
500.0000 mg | ORAL_CAPSULE | Freq: Once | ORAL | Status: AC
  Administered 2017-11-06: 500 mg via ORAL
  Filled 2017-11-06: qty 1

## 2017-11-06 NOTE — Discharge Instructions (Signed)
You have been seen in the Emergency Department (ED) for some discomfort that is likely related to either pregnancy or urinary tract infection.  Please take the full weeklong course of antibiotics.  The bedside ultrasound was reassuring and demonstrated a fetus with cardiac activity, but it is very important that you follow-up either at the health department or with an OB/GYN provider to start getting prenatal care.  Please start taking prenatal vitamins (over the counter) if you are not already doing so.    If you develop any other symptoms that concern you (including, but not limited to, persistent vomiting, worsening bleeding, abdominal or pelvic pain, or fever greater than 101), please return immediately to the Emergency Department.

## 2017-11-06 NOTE — ED Notes (Signed)
Pt drinking sprite at request and MD OK

## 2017-11-06 NOTE — ED Notes (Addendum)
Pt uprite on stretcher in exam room with no distress noted; reports since yesterday having right flank/side pain; denies radiating pain, denies any accomp symptoms; pt G2P1, approx 9wks pregn; no estab prenatal care yet; resp even/unlab, lungs clear, apical audible & reg, +BS, abd soft/nondist; pt voices understanding of need to obtain urine sample; st unable to void at this time

## 2017-11-06 NOTE — ED Provider Notes (Signed)
[facility] Emergency Department Provider Note  ____________________________________________   First MD Initiated Contact with Patient 11/06/17 0023     (approximate)  I have reviewed the triage vital signs and the nursing notes.   HISTORY  Chief Complaint Flank Pain    HPI Christie Green is a 22 y.o. female with frequent visits to the emergency department for variety of complaints who presents per private vehicle for evaluation of some intermittent pain in her right flank as well as the right side of her back.  It does not radiate.  She describes it as aching.  She says that she is pregnant but is not certain how far along she is.  She has irregular periods and her last menstrual cycle occurred about 4 months ago.  She thinks she is about [redacted] weeks pregnant but has not followed up for any prenatal care since her last emergency department visit about 1 month ago.  She plans to go to the health department.  Nothing particular makes the pain better or worse and it is mild to moderate.  She denies vaginal bleeding or vaginal discharge.  She denies fever/chills, chest pain, shortness of breath, nausea, and vomiting.  She has no history of kidney stones.  Past Medical History:  Diagnosis Date  . UTI (urinary tract infection) 09/2015    Patient Active Problem List   Diagnosis Date Noted  . Normal labor 11/30/2015  . Braxton Hick's contraction 11/29/2015  . Abdominal pain affecting pregnancy 11/29/2015  . Irregular contractions 11/15/2015  . UTI in pregnancy, antepartum, third trimester 11/05/2015  . Preterm uterine contractions 11/05/2015  . Pregnancy 10/30/2015    History reviewed. No pertinent surgical history.  Prior to Admission medications   Medication Sig Start Date End Date Taking? Authorizing Provider  amoxicillin (AMOXIL) 500 MG capsule Take 1 capsule (500 mg total) by mouth 3 (three) times daily. 10/04/17   Irean Hong, MD  cephALEXin (KEFLEX) 500 MG  capsule Take 1 capsule (500 mg total) by mouth 2 (two) times daily. 11/06/17   Loleta Rose, MD  dicyclomine (BENTYL) 20 MG tablet Take 1 tablet (20 mg total) by mouth 3 (three) times daily as needed for spasms. 06/22/17   Emily Filbert, MD  diphenhydrAMINE (BENADRYL) 25 mg capsule Take 1 capsule (25 mg total) by mouth every 4 (four) hours as needed. 04/29/16 04/29/17  Triplett, Rulon Eisenmenger B, FNP  HYDROcodone-acetaminophen (HYCET) 7.5-325 mg/15 ml solution Take 10 mLs by mouth every 6 (six) hours as needed for moderate pain. 10/04/17 10/04/18  Irean Hong, MD  hydrocortisone 2.5 % ointment Apply topically 2 (two) times daily. 09/02/17   Triplett, Rulon Eisenmenger B, FNP  ibuprofen (ADVIL,MOTRIN) 600 MG tablet Take 1 tablet (600 mg total) by mouth every 8 (eight) hours as needed. 07/09/17   Merrily Brittle, MD  ondansetron (ZOFRAN ODT) 4 MG disintegrating tablet Take 1 tablet (4 mg total) by mouth every 8 (eight) hours as needed for nausea or vomiting. 10/04/17   Irean Hong, MD  Prenatal Vit-Fe Fumarate-FA (PRENATAL VITAMIN PLUS LOW IRON) 27-1 MG TABS Take 1 tablet by mouth every morning. 05/17/15   Emily Filbert, MD    Allergies Patient has no known allergies.  No family history on file.  Social History Social History   Tobacco Use  . Smoking status: Former Smoker    Packs/day: 0.50    Years: 4.00    Pack years: 2.00    Types: Cigarettes  . Smokeless tobacco: Never  Used  Substance Use Topics  . Alcohol use: Not Currently    Alcohol/week: 14.0 standard drinks    Types: 14 Cans of beer per week  . Drug use: No    Review of Systems Constitutional: No fever/chills Eyes: No visual changes. ENT: No sore throat. Cardiovascular: Denies chest pain. Respiratory: Denies shortness of breath. Gastrointestinal: Pain in her right flank and right side of her back, intermittent, as described above.  No nausea no vomiting Genitourinary: Negative for dysuria.  No vaginal bleeding.  Early  pregnancy. Musculoskeletal: Negative for neck pain.   Integumentary: Negative for rash. Neurological: Negative for headaches, focal weakness or numbness.   ____________________________________________   PHYSICAL EXAM:  VITAL SIGNS: ED Triage Vitals [11/05/17 2140]  Enc Vitals Group     BP (!) 119/42     Pulse Rate 66     Resp 18     Temp 98.7 F (37.1 C)     Temp Source Oral     SpO2 98 %     Weight 91 kg (200 lb 9.9 oz)     Height      Head Circumference      Peak Flow      Pain Score 4     Pain Loc      Pain Edu?      Excl. in GC?     Constitutional: Alert and oriented. Well appearing and in no acute distress. Eyes: Conjunctivae are normal.  Head: Atraumatic. Nose: No congestion/rhinnorhea. Mouth/Throat: Mucous membranes are moist. Neck: No stridor.  No meningeal signs.   Cardiovascular: Normal rate, regular rhythm. Good peripheral circulation. Grossly normal heart sounds. Respiratory: Normal respiratory effort.  No retractions. Lungs CTAB. Gastrointestinal: Obese.  Soft and nontender. No distention.  Musculoskeletal: No lower extremity tenderness nor edema. No gross deformities of extremities. Neurologic:  Normal speech and language. No gross focal neurologic deficits are appreciated.  Skin:  Skin is warm, dry and intact. No rash noted. Psychiatric: Mood and affect are normal. Speech and behavior are normal.  ____________________________________________   LABS (all labs ordered are listed, but only abnormal results are displayed)  Labs Reviewed  URINALYSIS, COMPLETE (UACMP) WITH MICROSCOPIC - Abnormal; Notable for the following components:      Result Value   Color, Urine AMBER (*)    APPearance CLOUDY (*)    Ketones, ur 20 (*)    Protein, ur 30 (*)    Leukocytes, UA MODERATE (*)    Bacteria, UA FEW (*)    All other components within normal limits  BASIC METABOLIC PANEL - Abnormal; Notable for the following components:   Glucose, Bld 104 (*)    All  other components within normal limits  HCG, QUANTITATIVE, PREGNANCY - Abnormal; Notable for the following components:   hCG, Beta Chain, Quant, [place] 161,096 (*)    All other components within normal limits  POCT PREGNANCY, URINE - Abnormal; Notable for the following components:   Preg Test, Ur POSITIVE (*)    All other components within normal limits  URINE CULTURE  CBC  POC URINE PREG, ED   ____________________________________________  EKG  No indication for EKG ____________________________________________  RADIOLOGY   ED MD interpretation: No indication for emergent imaging  Official radiology report(s): No results found.  ____________________________________________   PROCEDURES  Critical Care performed: No   Procedure(s) performed:   Procedures   ____________________________________________   INITIAL IMPRESSION / ASSESSMENT AND PLAN / ED COURSE  As part of my medical decision making, I  reviewed the following data within the electronic MEDICAL RECORD NUMBER Nursing notes reviewed and incorporated, Labs reviewed , Old chart reviewed, bedside ultrasound performed,  and reviewed Notes from prior ED visits    Differential diagnosis includes, but is not limited to, musculoskeletal pain, UTI/pyelonephritis, renal colic, less likely biliary colic, appendicitis, diverticulitis.  The patient is in no distress and has intermittent pain.  She admits to not following up with prenatal care.  She has no OB complaints at this time such as vaginal bleeding.  I performed a bedside ultrasound and the patient I were able to visualize a single intrauterine pregnancy with visible fetal cardiac activity.  I explained that this is not an " official" ultrasound but rather a screening exam to make sure that she does have a visible pregnancy which she does, but I cannot estimate age or rule out any intrauterine pathology.  However the ultrasound is reassuring and she is in no pain or distress.  I am  awaiting the results of her urinalysis but encourage the use of over-the-counter Tylenol and outpatient follow-up as soon as possible with the health department for prenatal care.  She says she understands and agrees with the plan.  There is no indication for a pelvic exam or other imaging at this time.  Clinical Course as of Nov 06 221  Thu Nov 06, 2017  0218 Beta hCG is 162,000 which is appropriate for what she believes to be the duration of her pregnancy (about 9 weeks).  Urinalysis is contaminated with squamous cells but does appear to be positive.  Given that she is pregnant I will treat empirically with Keflex.  She got her first dose of Keflex 500 mg by mouth in the ED and I am writing her prescription as described below.  I once again reiterated to her the need for outpatient follow-up and she agrees with the plan.  I gave my usual and customary return precautions.   [CF]    Clinical Course User Index [CF] Loleta Rose, MD    ____________________________________________  FINAL CLINICAL IMPRESSION(S) / ED DIAGNOSES  Final diagnoses:  Early stage of pregnancy  Urinary tract infection in mother during pregnancy, antepartum     MEDICATIONS GIVEN DURING THIS VISIT:  Medications  cephALEXin (KEFLEX) capsule 500 mg (has no administration in time range)     ED Discharge Orders         Ordered    cephALEXin (KEFLEX) 500 MG capsule  2 times daily     11/06/17 0220           Note:  This document was prepared using Dragon voice recognition software and may include unintentional dictation errors.    Loleta Rose, MD 11/06/17 [phone]

## 2017-11-06 NOTE — ED Notes (Signed)

## 2017-11-07 LAB — URINE CULTURE: SPECIAL REQUESTS: NORMAL

## 2017-12-03 ENCOUNTER — Emergency Department
Admission: EM | Admit: 2017-12-03 | Discharge: 2017-12-03 | Disposition: A | Discharge status: Home / Self Care | Payer: Medicaid Other | Attending: Emergency Medicine | Admitting: Emergency Medicine

## 2017-12-03 ENCOUNTER — Emergency Department: Payer: Medicaid Other

## 2017-12-03 ENCOUNTER — Encounter: Payer: Self-pay | Admitting: Emergency Medicine

## 2017-12-03 DIAGNOSIS — O9989 Other specified diseases and conditions complicating pregnancy, childbirth and the puerperium: Secondary | ICD-10-CM | POA: Diagnosis not present

## 2017-12-03 DIAGNOSIS — O209 Hemorrhage in early pregnancy, unspecified: Secondary | ICD-10-CM

## 2017-12-03 DIAGNOSIS — B372 Candidiasis of skin and nail: Secondary | ICD-10-CM | POA: Insufficient documentation

## 2017-12-03 DIAGNOSIS — O4690 Antepartum hemorrhage, unspecified, unspecified trimester: Secondary | ICD-10-CM

## 2017-12-03 DIAGNOSIS — N83201 Unspecified ovarian cyst, right side: Secondary | ICD-10-CM

## 2017-12-03 DIAGNOSIS — Z3A16 16 weeks gestation of pregnancy: Secondary | ICD-10-CM | POA: Insufficient documentation

## 2017-12-03 DIAGNOSIS — O200 Threatened abortion: Secondary | ICD-10-CM | POA: Diagnosis not present

## 2017-12-03 DIAGNOSIS — L304 Erythema intertrigo: Secondary | ICD-10-CM

## 2017-12-03 DIAGNOSIS — N83291 Other ovarian cyst, right side: Secondary | ICD-10-CM | POA: Insufficient documentation

## 2017-12-03 LAB — CBC WITH DIFFERENTIAL/PLATELET
Abs Immature Granulocytes: 0.02 10*3/uL (ref 0.00–0.07)
BASOS ABS: 0 10*3/uL (ref 0.0–0.1)
BASOS PCT: 0 %
Eosinophils Absolute: 0.1 10*3/uL (ref 0.0–0.5)
Eosinophils Relative: 1 %
HEMATOCRIT: 37.5 % (ref 36.0–46.0)
Hemoglobin: 12.5 g/dL (ref 12.0–15.0)
IMMATURE GRANULOCYTES: 0 %
LYMPHS ABS: 2.6 10*3/uL (ref 0.7–4.0)
Lymphocytes Relative: 32 %
MCH: 30 pg (ref 26.0–34.0)
MCHC: 33.3 g/dL (ref 30.0–36.0)
MCV: 89.9 fL (ref 80.0–100.0)
Monocytes Absolute: 0.8 10*3/uL (ref 0.1–1.0)
Monocytes Relative: 9 %
NEUTROS PCT: 58 %
NRBC: 0 % (ref 0.0–0.2)
Neutro Abs: 4.8 10*3/uL (ref 1.7–7.7)
PLATELETS: 199 10*3/uL (ref 150–400)
RBC: 4.17 MIL/uL (ref 3.87–5.11)
RDW: 13.2 % (ref 11.5–15.5)
WBC: 8.3 10*3/uL (ref 4.0–10.5)

## 2017-12-03 LAB — URINALYSIS, COMPLETE (UACMP) WITH MICROSCOPIC
Bacteria, UA: NONE SEEN
Bilirubin Urine: NEGATIVE
GLUCOSE, UA: NEGATIVE mg/dL
Hgb urine dipstick: NEGATIVE
Ketones, ur: 20 mg/dL — AB
Leukocytes, UA: NEGATIVE
NITRITE: NEGATIVE
PH: 6 (ref 5.0–8.0)
Protein, ur: NEGATIVE mg/dL
Specific Gravity, Urine: 1.024 (ref 1.005–1.030)

## 2017-12-03 LAB — HCG, QUANTITATIVE, PREGNANCY: HCG, BETA CHAIN, QUANT, S: 60441 m[IU]/mL — AB (ref <5)

## 2017-12-03 MED ORDER — NYSTATIN 100000 UNIT/GM EX OINT: 1.0000 "application " | TOPICAL_OINTMENT | Freq: Three times a day (TID) | CUTANEOUS | 0 refills | Status: DC

## 2017-12-03 NOTE — ED Triage Notes (Signed)
Pt arrived via EMS from home where pt experienced 1 episode of bright red blood while wiping post urinating. Pt is [redacted] weeks pregnant.

## 2017-12-03 NOTE — ED Notes (Signed)
Patient hypotensive. MD Roxan Hockeyobinson made aware. Instructed to proceed with discharge.

## 2017-12-03 NOTE — Discharge Instructions (Signed)
Avoid sexual intercourse, tampons, douching until seen by your doctor.  Drink plenty of fluids daily.  Return to the ER for worsening symptoms, soaking more than 1 maxi pad per hour, fainting or other concerns.

## 2017-12-03 NOTE — ED Provider Notes (Signed)
[facility] Emergency Department Provider Note   ____________________________________________   First MD Initiated Contact with Patient 12/03/17 0206     (approximate)  I have reviewed the triage vital signs and the nursing notes.   HISTORY  Chief Complaint Vaginal bleeding   HPI Christie Green is a 22 y.o. female brought to the ED from home via EMS with a chief complaint of vaginal spotting.  Patient is G2, P1 approximately [redacted] weeks pregnant by last menstrual period who urinated approximately 15 minutes prior to arrival and noted blood upon wiping.  Last sexual intercourse 2 days ago.  Denies recent fever, chills, chest pain, shortness of breath, abdominal pain, nausea, vomiting.  Also notes a rash underneath her breasts.   Past Medical History:  Diagnosis Date  . UTI (urinary tract infection) 09/2015    Patient Active Problem List   Diagnosis Date Noted  . Normal labor 11/30/2015  . Braxton Hick's contraction 11/29/2015  . Abdominal pain affecting pregnancy 11/29/2015  . Irregular contractions 11/15/2015  . UTI in pregnancy, antepartum, third trimester 11/05/2015  . Preterm uterine contractions 11/05/2015  . Pregnancy 10/30/2015    History reviewed. No pertinent surgical history.  Prior to Admission medications   Medication Sig Start Date End Date Taking? Authorizing Provider  amoxicillin (AMOXIL) 500 MG capsule Take 1 capsule (500 mg total) by mouth 3 (three) times daily. 10/04/17   Irean Hong, MD  cephALEXin (KEFLEX) 500 MG capsule Take 1 capsule (500 mg total) by mouth 2 (two) times daily. 11/06/17   Loleta Rose, MD  dicyclomine (BENTYL) 20 MG tablet Take 1 tablet (20 mg total) by mouth 3 (three) times daily as needed for spasms. 06/22/17   Emily Filbert, MD  diphenhydrAMINE (BENADRYL) 25 mg capsule Take 1 capsule (25 mg total) by mouth every 4 (four) hours as needed. 04/29/16 04/29/17  Triplett, Rulon Eisenmenger B, FNP  HYDROcodone-acetaminophen  (HYCET) 7.5-325 mg/15 ml solution Take 10 mLs by mouth every 6 (six) hours as needed for moderate pain. 10/04/17 10/04/18  Irean Hong, MD  hydrocortisone 2.5 % ointment Apply topically 2 (two) times daily. 09/02/17   Triplett, Rulon Eisenmenger B, FNP  ibuprofen (ADVIL,MOTRIN) 600 MG tablet Take 1 tablet (600 mg total) by mouth every 8 (eight) hours as needed. 07/09/17   Merrily Brittle, MD  nystatin ointment (MYCOSTATIN) Apply 1 application topically 3 (three) times daily. 12/03/17   Irean Hong, MD  ondansetron (ZOFRAN ODT) 4 MG disintegrating tablet Take 1 tablet (4 mg total) by mouth every 8 (eight) hours as needed for nausea or vomiting. 10/04/17   Irean Hong, MD  Prenatal Vit-Fe Fumarate-FA (PRENATAL VITAMIN PLUS LOW IRON) 27-1 MG TABS Take 1 tablet by mouth every morning. 05/17/15   Emily Filbert, MD    Allergies Patient has no known allergies.  History reviewed. No pertinent family history.  Social History Social History   Tobacco Use  . Smoking status: Former Smoker    Packs/day: 0.50    Years: 4.00    Pack years: 2.00    Types: Cigarettes  . Smokeless tobacco: Never Used  Substance Use Topics  . Alcohol use: Not Currently    Alcohol/week: 14.0 standard drinks    Types: 14 Cans of beer per week  . Drug use: No    Review of Systems  Constitutional: No fever/chills Eyes: No visual changes. ENT: No sore throat. Cardiovascular: Denies chest pain. Respiratory: Denies shortness of breath. Gastrointestinal: No abdominal pain.  No  nausea, no vomiting.  No diarrhea.  No constipation. Genitourinary: Positive for vaginal bleeding.  Negative for dysuria. Musculoskeletal: Negative for back pain. Skin: Positive for rash. Neurological: Negative for headaches, focal weakness or numbness.   ____________________________________________   PHYSICAL EXAM:  VITAL SIGNS: ED Triage Vitals  Enc Vitals Group     BP      Pulse      Resp      Temp      Temp src      SpO2      Weight        Height      Head Circumference      Peak Flow      Pain Score      Pain Loc      Pain Edu?      Excl. in GC?     Constitutional: Alert and oriented. Well appearing and in no acute distress. Eyes: Conjunctivae are normal. PERRL. EOMI. Head: Atraumatic. Nose: No congestion/rhinnorhea. Mouth/Throat: Mucous membranes are moist.  Oropharynx non-erythematous. Neck: No stridor.   Cardiovascular: Normal rate, regular rhythm. Grossly normal heart sounds.  Good peripheral circulation. Respiratory: Normal respiratory effort.  No retractions. Lungs CTAB. Gastrointestinal: Soft and nontender to light or deep palpation. No distention. No abdominal bruits. No CVA tenderness. Musculoskeletal: No lower extremity tenderness nor edema.  No joint effusions. Neurologic:  Normal speech and language. No gross focal neurologic deficits are appreciated. No gait instability. Skin:  Skin is warm, dry and intact.  Intertriginous rash beneath breasts.  Psychiatric: Mood and affect are normal. Speech and behavior are normal.  ____________________________________________   LABS (all labs ordered are listed, but only abnormal results are displayed)  Labs Reviewed  HCG, QUANTITATIVE, PREGNANCY - Abnormal; Notable for the following components:      Result Value   hCG, Beta Chain, Quant, S 60,441 (*)    All other components within normal limits  CBC WITH DIFFERENTIAL/PLATELET  URINALYSIS, COMPLETE (UACMP) WITH MICROSCOPIC   ____________________________________________  EKG  None ____________________________________________  RADIOLOGY  ED MD interpretation: 16-week 3-day IUP; right ovarian cyst  Official radiology report(s): [place] [facility] Limited  Result Date: 12/03/2017 CLINICAL DATA:  Pregnant patient with vaginal bleeding. EXAM: LIMITED OBSTETRIC ULTRASOUND FINDINGS: Number of Fetuses: 1 Heart Rate:  152 bpm Movement: Yes Presentation: Variable Placental Location: Posterior Previa: No Amniotic Fluid  (Subjective):  Within normal limits. BPD: 3.36 cm 16 w  3 d MATERNAL FINDINGS: Cervix:  Appears closed. Uterus/Adnexae: Right ovarian cyst measures 4.2 cm, normal blood flow to the ovarian parenchyma. The left ovary is normal. No pelvic free fluid. IMPRESSION: 1. Single live intrauterine pregnancy estimated gestational age [redacted] weeks 3 days based on biparietal diameter. 2. Right ovarian cyst measuring 4.2 cm is considered benign based on size, no dedicated imaging follow-up is needed. This exam is performed on an emergent basis and does not comprehensively evaluate fetal size, dating, or anatomy; follow-up complete OB US should be considered if further fetal assessment is warranted. Electronically Signed   By: Narda RutherfordMelanie  Sanford M.D.   On: 12/03/2017 05:35    ____________________________________________   PROCEDURES  Procedure(s) performed:   Pelvic deferred  Procedures  Critical Care performed: No  ____________________________________________   INITIAL IMPRESSION / ASSESSMENT AND PLAN / ED COURSE  As part of my medical decision making, I reviewed the following data within the electronic MEDICAL RECORD NUMBER Nursing notes reviewed and incorporated, old chart reviewed, labs reviewed, Radiograph reviewed and Notes from prior ED visits  22 year old female G1 P0 approximately [redacted] weeks pregnant by dates who presents with blood upon wiping on urination. Differential diagnosis includes, but is not limited to, ovarian cyst, ovarian torsion, acute appendicitis, diverticulitis, urinary tract infection/pyelonephritis, endometriosis, bowel obstruction, colitis, renal colic, gastroenteritis, hernia, fibroids, endometriosis, pregnancy related pain including ectopic pregnancy, etc.   Will obtain CBC, beta hCG, ABO/Rh.  Proceed with pelvic ultrasound.  Nystatin ointment for intertriginous rash.  Clinical Course as of Dec 04 718  Wed Dec 03, 2017  0529 Reviewed old records; patient's blood type is O+ from  lab draw 11/2015   [JS]  9604 Updated patient of laboratory and ultrasound results.  She will attempt to provide urine specimen.   [JS]  0720 Care transferred to Dr. Roxan Hockey pending UA result.  Anticipate discharge home with follow-up with OB/GYN.   [JS]    Clinical Course User Index [JS] Irean Hong, MD     ____________________________________________   FINAL CLINICAL IMPRESSION(S) / ED DIAGNOSES  Final diagnoses:  Vaginal bleeding in pregnancy, first trimester  Threatened miscarriage in early pregnancy  Intertrigo  Cyst of right ovary     ED Discharge Orders         Ordered    nystatin ointment (MYCOSTATIN)  3 times daily     12/03/17 5409           Note:  This document was prepared using Dragon voice recognition software and may include unintentional dictation errors.    Irean Hong, MD 12/03/17 [phone]

## 2018-01-07 ENCOUNTER — Observation Stay
Admission: EM | Admit: 2018-01-07 | Discharge: 2018-01-07 | Disposition: A | Discharge status: Home / Self Care | Payer: Medicaid Other | Attending: Obstetrics and Gynecology | Admitting: Obstetrics and Gynecology

## 2018-01-07 ENCOUNTER — Encounter: Payer: Self-pay | Admitting: *Deleted

## 2018-01-07 ENCOUNTER — Other Ambulatory Visit: Payer: Self-pay

## 2018-01-07 DIAGNOSIS — O26892 Other specified pregnancy related conditions, second trimester: Principal | ICD-10-CM | POA: Insufficient documentation

## 2018-01-07 DIAGNOSIS — R109 Unspecified abdominal pain: Secondary | ICD-10-CM | POA: Insufficient documentation

## 2018-01-07 DIAGNOSIS — O4290 Premature rupture of membranes, unspecified as to length of time between rupture and onset of labor, unspecified weeks of gestation: Secondary | ICD-10-CM | POA: Diagnosis present

## 2018-01-07 DIAGNOSIS — Z3A24 24 weeks gestation of pregnancy: Secondary | ICD-10-CM | POA: Diagnosis not present

## 2018-01-07 DIAGNOSIS — Z3490 Encounter for supervision of normal pregnancy, unspecified, unspecified trimester: Secondary | ICD-10-CM

## 2018-01-07 DIAGNOSIS — O26893 Other specified pregnancy related conditions, third trimester: Secondary | ICD-10-CM | POA: Diagnosis not present

## 2018-01-07 DIAGNOSIS — N898 Other specified noninflammatory disorders of vagina: Secondary | ICD-10-CM | POA: Diagnosis present

## 2018-01-07 DIAGNOSIS — R102 Pelvic and perineal pain: Secondary | ICD-10-CM

## 2018-01-07 DIAGNOSIS — M549 Dorsalgia, unspecified: Secondary | ICD-10-CM

## 2018-01-07 LAB — WET PREP, GENITAL
CLUE CELLS WET PREP: NONE SEEN
Sperm: NONE SEEN
TRICH WET PREP: NONE SEEN
YEAST WET PREP: NONE SEEN

## 2018-01-07 MED ORDER — ACETAMINOPHEN 500 MG PO TABS: 1000.0000 mg | ORAL_TABLET | Freq: Four times a day (QID) | ORAL | Status: DC | PRN

## 2018-01-07 NOTE — OB Triage Note (Signed)
Pt states that around 1am she was sitting on the toilet because she had to urinate. In the process of urinating she coughed and felt a gush of fluid. She states she had also had lower abdominal and back pain for that last several weeks that rates up to an 8/10. It is currently a 5/10. EFM applied, will monitor closely. Pt oriented to room.

## 2018-01-07 NOTE — L&D Delivery Note (Signed)
Delivery Summary for Christie Green  Labor Events:   Preterm labor:   Rupture date: 05/13/2018  Rupture time: 7:23 AM  Rupture type: Artificial Intact  Fluid Color:   Induction:   Augmentation:   Complications:   Cervical ripening:          Delivery:   Episiotomy:   Lacerations:   Repair suture:   Repair # of packets:   Blood loss (ml): 250   Information for the patient's newborn:  Joline, Slominski [[id number]]    Delivery 05/13/2018 1:57 PM by  Vaginal, Spontaneous Sex:  female Gestational Age: [redacted]w[redacted]d Delivery Clinician:   Living?:         APGARS  One minute Five minutes Ten minutes  Skin color:        Heart rate:        Grimace:        Muscle tone:        Breathing:        Totals: 8  9      Presentation/position:      Resuscitation:   Cord information:    Disposition of cord blood:     Blood gases sent?  Complications:   Placenta: Delivered:       appearance Newborn Measurements: Weight: 6 lb 10.2 oz (3010 g)  Height: 19.29"  Head circumference:    Chest circumference:    Other providers:    Additional  information: Forceps:   Vacuum:   Breech:   Observed anomalies         Delivery Note At 1:57 PM a viable and healthy female was delivered via Vaginal, Spontaneous (Presentation: Vertex; LOA position).  APGAR: 8, 9; weight 3610 grams.   Placenta status: spontaneously removed, intact.  Cord: 3-vessel with the following complications: none.  Cord pH: not obtained. Delayed cord clamping observed.  Anesthesia:  Epidural Episiotomy: None Lacerations: None Suture Repair: None Est. Blood Loss (mL):  250  Mom to postpartum.  Baby to Couplet care / Skin to Skin.  Hildred Laser 05/13/2018, 2:29 PM

## 2018-01-07 NOTE — Final Progress Note (Signed)
L&D OB Triage Note  HPI:  Christie Green is a 23 y.o. G2P0 unassigned female at [redacted]w[redacted]d, Estimated Date of Delivery: 04/27/18 who presents for complaints of gush of fluid.  She states that she was sitting on the toilet attempting to urinate.  Notes that immediately after, she coughed and felt another gush of fluid and was concerned that her water had broken. S he also notes lower abdominal back pain and lower abdominal pain that has been ongoing for several weeks. Pain ranges between 5-8 out of 10, is intermittent. Denies urinary symptoms (no dysuria, hematuria, frequency).  Patient receives [facility] at [facility].    OB History  Gravida Para Term Preterm AB Living  2            SAB TAB Ectopic Multiple Live Births          1    # Outcome Date GA Lbr Len/2nd Weight Sex Delivery Anes PTL Lv  2 Current           1 Gravida             Patient Active Problem List   Diagnosis Date Noted  . Normal labor 11/30/2015  . Braxton Hick's contraction 11/29/2015  . Abdominal pain affecting pregnancy 11/29/2015  . Irregular contractions 11/15/2015  . UTI in pregnancy, antepartum, third trimester 11/05/2015  . Preterm uterine contractions 11/05/2015  . Pregnancy 10/30/2015    Past Medical History:  Diagnosis Date  . UTI (urinary tract infection) 09/2015    No current facility-administered medications on file prior to encounter.    Current Outpatient Medications on File Prior to Encounter  Medication Sig Dispense Refill  . Prenatal Vit-Fe Fumarate-FA (PRENATAL VITAMIN PLUS LOW IRON) 27-1 MG TABS Take 1 tablet by mouth every morning. 30 tablet 7  . amoxicillin (AMOXIL) 500 MG capsule Take 1 capsule (500 mg total) by mouth 3 (three) times daily. (Patient not taking: Reported on 12/03/2017) 21 capsule 0  . cephALEXin (KEFLEX) 500 MG capsule Take 1 capsule (500 mg total) by mouth 2 (two) times daily. (Patient not taking: Reported on 12/03/2017) 14 capsule 0  . dicyclomine (BENTYL) 20 MG tablet Take 1 tablet (20  mg total) by mouth 3 (three) times daily as needed for spasms. (Patient not taking: Reported on 12/03/2017) 20 tablet 0  . diphenhydrAMINE (BENADRYL) 25 mg capsule Take 1 capsule (25 mg total) by mouth every 4 (four) hours as needed. 30 capsule 0  . HYDROcodone-acetaminophen (HYCET) 7.5-325 mg/15 ml solution Take 10 mLs by mouth every 6 (six) hours as needed for moderate pain. (Patient not taking: Reported on 12/03/2017) 120 mL 0  . hydrocortisone 2.5 % ointment Apply topically 2 (two) times daily. (Patient not taking: Reported on 12/03/2017) 30 g 0  . ibuprofen (ADVIL,MOTRIN) 600 MG tablet Take 1 tablet (600 mg total) by mouth every 8 (eight) hours as needed. (Patient not taking: Reported on 01/07/2018) 30 tablet 0  . nystatin ointment (MYCOSTATIN) Apply 1 application topically 3 (three) times daily. (Patient not taking: Reported on 01/07/2018) 30 g 0  . ondansetron (ZOFRAN ODT) 4 MG disintegrating tablet Take 1 tablet (4 mg total) by mouth every 8 (eight) hours as needed for nausea or vomiting. (Patient not taking: Reported on 12/03/2017) 20 tablet 0    No Known Allergies   ROS:  Review of Systems - Negative except what is noted in HPI.    Physical Exam:  Blood pressure 127/63, pulse 62, temperature 98.2 F (36.8 C), temperature source Oral,  resp. rate 16, height 5\' 6"  (1.676 m), weight 90.7 kg. General appearance: alert and no distress Abdomen: soft, non-tender; bowel sounds normal; no masses,  no organomegaly. Gravid.  Pelvic: external genitalia normal, rectovaginal septum normal.  Vagina with moderate thin white discharge. No fluid noted in vaginal vault or on Valsalva. Cervix normal appearing, no lesions and no motion tenderness, closed.  Bimanual exam not performed.  Extremities: extremities normal, atraumatic, no cyanosis or edema  Neurologic: grossly intact.  NST INTERPRETATION: Indications: patient reassurance  Mode: External Baseline Rate (A): 155 bpm(FHT) Variability:  Moderate Accelerations: 15 x 15 Decelerations: Variable     Contraction Frequency (min): none  Impression: reactive   Labs:  Results for orders placed or performed during the hospital encounter of 01/07/18  Wet prep, genital  Result Value Ref Range   Yeast Wet Prep HPF POC NONE SEEN NONE SEEN   Trich, Wet Prep NONE SEEN NONE SEEN   Clue Cells Wet Prep HPF POC NONE SEEN NONE SEEN   WBC, Wet Prep HPF POC MODERATE (A) NONE SEEN   Sperm NONE SEEN     Assessment:  23 y.o. G2P0 at [redacted]w[redacted]d with:  1. Vaginal discharge, ruled out for ruptured membranes.  2. Abdominal/back pain   Plan:  1. Wet prep performed. Given reassurance that amniotic sac remained intact. Was likely more urine or even discharge that she noted.  2. Abdominal/back pain may be secondary to pregnancy or discharge. Patient denies urinary symptoms.  3. Discharge home. F/u with OB/GYN for next appointment as previously scheduled.    Hildred Laser, MD [facility]

## 2018-01-22 ENCOUNTER — Other Ambulatory Visit: Payer: Self-pay | Admitting: Advanced Practice Midwife

## 2018-01-22 DIAGNOSIS — E669 Obesity, unspecified: Secondary | ICD-10-CM | POA: Insufficient documentation

## 2018-01-22 DIAGNOSIS — Z8659 Personal history of other mental and behavioral disorders: Secondary | ICD-10-CM | POA: Insufficient documentation

## 2018-01-22 DIAGNOSIS — Z3482 Encounter for supervision of other normal pregnancy, second trimester: Secondary | ICD-10-CM

## 2018-01-22 DIAGNOSIS — R443 Hallucinations, unspecified: Secondary | ICD-10-CM | POA: Insufficient documentation

## 2018-01-22 LAB — OB RESULTS CONSOLE HIV ANTIBODY (ROUTINE TESTING): HIV: NONREACTIVE

## 2018-01-22 LAB — HM PAP SMEAR: HM Pap smear: NEGATIVE

## 2018-01-23 DIAGNOSIS — O99810 Abnormal glucose complicating pregnancy: Secondary | ICD-10-CM

## 2018-01-23 HISTORY — DX: Abnormal glucose complicating pregnancy: O99.810

## 2018-01-23 LAB — OB RESULTS CONSOLE HEPATITIS B SURFACE ANTIGEN: Hepatitis B Surface Ag: NEGATIVE

## 2018-01-23 LAB — OB RESULTS CONSOLE RPR: RPR: NONREACTIVE

## 2018-01-24 LAB — OB RESULTS CONSOLE GC/CHLAMYDIA
Chlamydia: NEGATIVE
Gonorrhea: NEGATIVE

## 2018-01-27 ENCOUNTER — Ambulatory Visit
Admission: RE | Admit: 2018-01-27 | Discharge: 2018-01-27 | Disposition: A | Discharge status: Home / Self Care | Payer: Medicaid Other | Source: Ambulatory Visit | Attending: Advanced Practice Midwife | Admitting: Advanced Practice Midwife

## 2018-01-27 DIAGNOSIS — Z3482 Encounter for supervision of other normal pregnancy, second trimester: Secondary | ICD-10-CM | POA: Insufficient documentation

## 2018-02-19 ENCOUNTER — Observation Stay
Admission: EM | Admit: 2018-02-19 | Discharge: 2018-02-19 | Disposition: A | Discharge status: Home / Self Care | Payer: Medicaid Other | Attending: Obstetrics and Gynecology | Admitting: Obstetrics and Gynecology

## 2018-02-19 ENCOUNTER — Other Ambulatory Visit: Payer: Self-pay

## 2018-02-19 DIAGNOSIS — O4693 Antepartum hemorrhage, unspecified, third trimester: Secondary | ICD-10-CM | POA: Diagnosis present

## 2018-02-19 DIAGNOSIS — O26853 Spotting complicating pregnancy, third trimester: Secondary | ICD-10-CM | POA: Diagnosis not present

## 2018-02-19 DIAGNOSIS — R58 Hemorrhage, not elsewhere classified: Secondary | ICD-10-CM | POA: Diagnosis present

## 2018-02-19 DIAGNOSIS — Z3A28 28 weeks gestation of pregnancy: Secondary | ICD-10-CM

## 2018-02-19 NOTE — OB Triage Note (Signed)
Discharge paperwork provided and reviewed.  Follow up care discussed.  Pt verbalized understanding.

## 2018-02-19 NOTE — Discharge Summary (Addendum)
L&D OB Triage Note  SUBJECTIVE Christie Green is a 23 y.o. G2P0 female at 2265w2d, EDD Estimated Date of Delivery: 05/12/18 who presented to triage with complaints of small amount bleeding noted X1 when wiping. This has resolved since presentation to L&D.   OB History  Gravida Para Term Preterm AB Living  2 0 0 0 0 1  SAB TAB Ectopic Multiple Live Births  0 0 0 0 1    # Outcome Date GA Lbr Len/2nd Weight Sex Delivery Anes PTL Lv  2 Current           1 Gravida             Medications Prior to Admission  Medication Sig Dispense Refill Last Dose  . Prenatal Vit-Fe Fumarate-FA (PRENATAL VITAMIN PLUS LOW IRON) 27-1 MG TABS Take 1 tablet by mouth every morning. 30 tablet 7 02/19/2018 at Unknown time  . amoxicillin (AMOXIL) 500 MG capsule Take 1 capsule (500 mg total) by mouth 3 (three) times daily. (Patient not taking: Reported on 12/03/2017) 21 capsule 0 Not Taking at Unknown time  . cephALEXin (KEFLEX) 500 MG capsule Take 1 capsule (500 mg total) by mouth 2 (two) times daily. (Patient not taking: Reported on 12/03/2017) 14 capsule 0 Not Taking at Unknown time  . dicyclomine (BENTYL) 20 MG tablet Take 1 tablet (20 mg total) by mouth 3 (three) times daily as needed for spasms. (Patient not taking: Reported on 12/03/2017) 20 tablet 0 Not Taking at Unknown time  . diphenhydrAMINE (BENADRYL) 25 mg capsule Take 1 capsule (25 mg total) by mouth every 4 (four) hours as needed. 30 capsule 0   . HYDROcodone-acetaminophen (HYCET) 7.5-325 mg/15 ml solution Take 10 mLs by mouth every 6 (six) hours as needed for moderate pain. (Patient not taking: Reported on 12/03/2017) 120 mL 0 Not Taking at Unknown time  . hydrocortisone 2.5 % ointment Apply topically 2 (two) times daily. (Patient not taking: Reported on 12/03/2017) 30 g 0 Not Taking at Unknown time  . ibuprofen (ADVIL,MOTRIN) 600 MG tablet Take 1 tablet (600 mg total) by mouth every 8 (eight) hours as needed. (Patient not taking: Reported on 01/07/2018) 30  tablet 0 Not Taking at Unknown time  . nystatin ointment (MYCOSTATIN) Apply 1 application topically 3 (three) times daily. (Patient not taking: Reported on 01/07/2018) 30 g 0 Not Taking at Unknown time  . ondansetron (ZOFRAN ODT) 4 MG disintegrating tablet Take 1 tablet (4 mg total) by mouth every 8 (eight) hours as needed for nausea or vomiting. (Patient not taking: Reported on 12/03/2017) 20 tablet 0 Not Taking at Unknown time     OBJECTIVE  Nursing Evaluation:   BP (!) 110/59 (BP Location: Right Arm)   Pulse 79   Temp 97.6 F (36.4 C) (Oral)   Resp 16   Ht 5\' 6"  (1.676 m)   Wt 94.3 kg   BMI 33.57 kg/m    Findings:   Rare contractions.  No evidence of bleeding or ROM  NST was performed and has been reviewed by me. Mode: External  Baseline Rate (A): 150 bpm  Variability: Moderate  Accelerations: 15 X 15  Decelerations: None  Contraction Frequency (min): None     NST INTERPRETATION: Category I                   ASSESSMENT Impression:  1.  Pregnancy:  G2P0 at 2265w2d , EDD Estimated Date of Delivery: 05/12/18 2.  NST:  Category I  PLAN 1. Reassurance given 2. Discharge home with standard labor precautions given to return to L&D or call the office for problems. 3. Continue routine prenatal care.

## 2018-02-19 NOTE — OB Triage Note (Signed)
Pt arrived to triage via EMS with c/o "quarter-sized light red blood clot" per pt.  Pt denies LOF and contractions and is feeling baby move normally.  Pt denies additional bleeding with most recent void.  EFM and toco applied and assessing.

## 2018-03-03 ENCOUNTER — Encounter: Payer: Self-pay | Admitting: Emergency Medicine

## 2018-03-03 ENCOUNTER — Other Ambulatory Visit: Payer: Self-pay

## 2018-03-03 ENCOUNTER — Observation Stay
Admission: EM | Admit: 2018-03-03 | Discharge: 2018-03-03 | Disposition: A | Discharge status: Home / Self Care | Payer: Medicaid Other | Attending: Obstetrics and Gynecology | Admitting: Obstetrics and Gynecology

## 2018-03-03 DIAGNOSIS — Z3A30 30 weeks gestation of pregnancy: Secondary | ICD-10-CM | POA: Insufficient documentation

## 2018-03-03 DIAGNOSIS — R309 Painful micturition, unspecified: Secondary | ICD-10-CM | POA: Diagnosis not present

## 2018-03-03 DIAGNOSIS — Z3490 Encounter for supervision of normal pregnancy, unspecified, unspecified trimester: Secondary | ICD-10-CM

## 2018-03-03 DIAGNOSIS — N898 Other specified noninflammatory disorders of vagina: Principal | ICD-10-CM | POA: Insufficient documentation

## 2018-03-03 DIAGNOSIS — O26893 Other specified pregnancy related conditions, third trimester: Secondary | ICD-10-CM | POA: Insufficient documentation

## 2018-03-03 LAB — URINALYSIS, COMPLETE (UACMP) WITH MICROSCOPIC
BILIRUBIN URINE: NEGATIVE
Glucose, UA: NEGATIVE mg/dL
KETONES UR: NEGATIVE mg/dL
Nitrite: NEGATIVE
PROTEIN: NEGATIVE mg/dL
Specific Gravity, Urine: 1.008 (ref 1.005–1.030)
WBC, UA: >50 WBC/hpf — ABNORMAL HIGH (ref 0–5)
pH: 7 (ref 5.0–8.0)

## 2018-03-03 NOTE — Progress Notes (Signed)
Discharge instructions given to pt. Pt verbalized understanding. Pt stated as she was leaving that she really came in to see if she tested positive for gonorrhea/ chlamydia. I instructed her that we did not test for that given what she told us her complaint was and she said she will follow up and make an appointment [facility] in the morning.

## 2018-03-03 NOTE — OB Triage Note (Signed)
Recvd pt from ED. They sent a U/A and it has already resulted. Pt states she came in for white vaginal discharge. Pt states she only saw it one time when she wiped. She denies LOF or vaginal bleeding. Feeling baby move well. Pt has no other complaints and has no pain.

## 2018-03-03 NOTE — ED Notes (Signed)
Pt sent to L&D

## 2018-03-03 NOTE — Discharge Instructions (Signed)

## 2018-03-03 NOTE — ED Triage Notes (Signed)
PT c/o dysuria and discharge. Pt states her baby daddy was seen today and dx with gonorrhea. NAD noted. PT is 29wks preg.

## 2018-03-06 NOTE — Discharge Summary (Signed)
    L&D OB Triage Note  SUBJECTIVE Christie Green is a 23 y.o. G2P0 female at [redacted]w[redacted]d, EDD Estimated Date of Delivery: 05/12/18 who presented to triage with complaints of one episode of vaginal discharge.  She denies bleeding, rupture of membranes.  Reports active fetal movement..   OB History  Gravida Para Term Preterm AB Living  2 0 0 0 0 1  SAB TAB Ectopic Multiple Live Births  0 0 0 0 1    # Outcome Date GA Lbr Len/2nd Weight Sex Delivery Anes PTL Lv  2 Current           1 Gravida             No medications prior to admission.     OBJECTIVE  Nursing Evaluation:   BP (!) 120/53 (BP Location: Left Arm)   Pulse 75   Temp 97.9 F (36.6 C) (Oral)   Resp 16   SpO2 100%    Findings:   No vaginal discharge or bleeding noted.  NST was performed and has been reviewed by me.  NST INTERPRETATION: Category I  Mode: External Baseline Rate (A): 145 bpm Variability: Moderate Accelerations: 15 x 15 Decelerations: None     Contraction Frequency (min): irritability  ASSESSMENT Impression:  1.  Pregnancy:  G2P0 at [redacted]w[redacted]d , EDD Estimated Date of Delivery: 05/12/18 2.  NST:  Category I  PLAN 1. Reassurance given 2. Discharge home with standard labor precautions given to return to L&D or call the office for problems. 3. Continue routine prenatal care.

## 2018-04-16 DIAGNOSIS — O99019 Anemia complicating pregnancy, unspecified trimester: Secondary | ICD-10-CM

## 2018-04-16 HISTORY — DX: Anemia complicating pregnancy, unspecified trimester: O99.019

## 2018-04-16 LAB — HM HIV SCREENING LAB: HM HIV Screening: NEGATIVE

## 2018-05-02 ENCOUNTER — Other Ambulatory Visit: Payer: Self-pay

## 2018-05-02 ENCOUNTER — Observation Stay
Admission: EM | Admit: 2018-05-02 | Discharge: 2018-05-02 | Disposition: A | Discharge status: Home / Self Care | Payer: Medicaid Other | Attending: Obstetrics and Gynecology | Admitting: Obstetrics and Gynecology

## 2018-05-02 DIAGNOSIS — R102 Pelvic and perineal pain: Secondary | ICD-10-CM | POA: Diagnosis not present

## 2018-05-02 DIAGNOSIS — O26899 Other specified pregnancy related conditions, unspecified trimester: Secondary | ICD-10-CM | POA: Insufficient documentation

## 2018-05-02 DIAGNOSIS — O471 False labor at or after 37 completed weeks of gestation: Secondary | ICD-10-CM | POA: Diagnosis not present

## 2018-05-02 DIAGNOSIS — Z3490 Encounter for supervision of normal pregnancy, unspecified, unspecified trimester: Secondary | ICD-10-CM

## 2018-05-02 DIAGNOSIS — Z3A38 38 weeks gestation of pregnancy: Secondary | ICD-10-CM

## 2018-05-02 DIAGNOSIS — Z3A00 Weeks of gestation of pregnancy not specified: Secondary | ICD-10-CM | POA: Diagnosis not present

## 2018-05-02 NOTE — OB Triage Note (Signed)
Patient came in for observation for uterine contractions and vaginal pressure that started this evening at 1600. Patient rates pain 8/10. Patient denies leaking of fluid and reports brown discharge once when wiping fter using the bathroom. Vital signs stable and patient afebrile. FHR baseline 140 with moderate variability with accelerations 15 x 15 and no decelerations. Patient in room by herself. Will continue to monitor. Marland Kitchen

## 2018-05-02 NOTE — Discharge Summary (Signed)
Patient discharged with instructions on follow up appointments, pain management, labor precautions, and when to seek medical attention. Patient ambulatory at discharge with steady gait and no complaints. Patient walked down to medical mall entrance. Patient reports +FM at discharge.

## 2018-05-07 NOTE — Discharge Summary (Signed)
    L&D OB Triage Note  SUBJECTIVE Christie Green is a 23 y.o. G2P0 female at [redacted]w[redacted]d, EDD Estimated Date of Delivery: 05/12/18 who presented to triage with complaints of occasional contractions-pelvic pain..   OB History  Gravida Para Term Preterm AB Living  2 0 0 0 0 1  SAB TAB Ectopic Multiple Live Births  0 0 0 0 1    # Outcome Date GA Lbr Len/2nd Weight Sex Delivery Anes PTL Lv  2 Current           1 Gravida             No medications prior to admission.     OBJECTIVE  Nursing Evaluation:   BP 112/61 (BP Location: Right Arm)   Pulse 98   Temp 98.3 F (36.8 C) (Oral)   Resp 18    Findings:   Rare contractions-reassuring fetal heart rate.  Patient not in labor  NST was performed and has been reviewed by me.  NST INTERPRETATION: Category I  Mode: External Baseline Rate (A): 140 bpm Variability: Moderate Accelerations: 15 x 15 Decelerations: None     Contraction Frequency (min): none  ASSESSMENT Impression:  1.  Pregnancy:  G2P0 at [redacted]w[redacted]d , EDD Estimated Date of Delivery: 05/12/18 2.  NST:  Category I  PLAN 1. Reassurance given 2. Discharge home with standard labor precautions given to return to L&D or call the office for problems. 3. Continue routine prenatal care.

## 2018-05-12 ENCOUNTER — Inpatient Hospital Stay
Admission: EM | Admit: 2018-05-12 | Discharge: 2018-05-14 | DRG: 807 | Disposition: A | Discharge status: Home / Self Care | Payer: Medicaid Other | Attending: Obstetrics and Gynecology | Admitting: Obstetrics and Gynecology

## 2018-05-12 ENCOUNTER — Other Ambulatory Visit: Payer: Self-pay

## 2018-05-12 DIAGNOSIS — Z3A40 40 weeks gestation of pregnancy: Secondary | ICD-10-CM | POA: Diagnosis not present

## 2018-05-12 DIAGNOSIS — O26893 Other specified pregnancy related conditions, third trimester: Secondary | ICD-10-CM | POA: Diagnosis present

## 2018-05-12 DIAGNOSIS — O480 Post-term pregnancy: Secondary | ICD-10-CM | POA: Diagnosis not present

## 2018-05-12 DIAGNOSIS — Z87891 Personal history of nicotine dependence: Secondary | ICD-10-CM

## 2018-05-12 LAB — CBC WITH DIFFERENTIAL/PLATELET
Abs Immature Granulocytes: 0.05 10*3/uL (ref 0.00–0.07)
Basophils Absolute: 0 10*3/uL (ref 0.0–0.1)
Basophils Relative: 0 %
Eosinophils Absolute: 0.1 10*3/uL (ref 0.0–0.5)
Eosinophils Relative: 1 %
HCT: 35.6 % — ABNORMAL LOW (ref 36.0–46.0)
Hemoglobin: 11.9 g/dL — ABNORMAL LOW (ref 12.0–15.0)
Immature Granulocytes: 1 %
Lymphocytes Relative: 26 %
Lymphs Abs: 2.7 10*3/uL (ref 0.7–4.0)
MCH: 29.1 pg (ref 26.0–34.0)
MCHC: 33.4 g/dL (ref 30.0–36.0)
MCV: 87 fL (ref 80.0–100.0)
Monocytes Absolute: 0.8 10*3/uL (ref 0.1–1.0)
Monocytes Relative: 8 %
Neutro Abs: 6.8 10*3/uL (ref 1.7–7.7)
Neutrophils Relative %: 64 %
Platelets: 183 10*3/uL (ref 150–400)
RBC: 4.09 MIL/uL (ref 3.87–5.11)
RDW: 13.3 % (ref 11.5–15.5)
WBC: 10.4 10*3/uL (ref 4.0–10.5)
nRBC: 0 % (ref 0.0–0.2)

## 2018-05-12 MED ORDER — LIDOCAINE HCL (PF) 1 % IJ SOLN
INTRAMUSCULAR | Status: AC
  Filled 2018-05-12: qty 30

## 2018-05-12 MED ORDER — BUTORPHANOL TARTRATE 2 MG/ML IJ SOLN
1.0000 mg | INTRAMUSCULAR | Status: DC | PRN
  Administered 2018-05-13 (×2): 1 mg via INTRAVENOUS
  Filled 2018-05-12 (×2): qty 1

## 2018-05-12 MED ORDER — SOD CITRATE-CITRIC ACID 500-334 MG/5ML PO SOLN: 30.0000 mL | ORAL | Status: DC | PRN

## 2018-05-12 MED ORDER — LACTATED RINGERS IV SOLN
Freq: Once | INTRAVENOUS | Status: AC
  Administered 2018-05-13: via INTRAVENOUS

## 2018-05-12 MED ORDER — LIDOCAINE HCL (PF) 1 % IJ SOLN: 30.0000 mL | INTRAMUSCULAR | Status: DC | PRN

## 2018-05-12 MED ORDER — OXYTOCIN BOLUS FROM INFUSION
500.0000 mL | Freq: Once | INTRAVENOUS | Status: AC
  Administered 2018-05-13: 14:00:00 500 mL via INTRAVENOUS

## 2018-05-12 MED ORDER — LACTATED RINGERS IV SOLN
INTRAVENOUS | Status: DC
  Administered 2018-05-13 (×3): via INTRAVENOUS

## 2018-05-12 MED ORDER — MISOPROSTOL 200 MCG PO TABS
ORAL_TABLET | ORAL | Status: AC
  Filled 2018-05-12: qty 4

## 2018-05-12 MED ORDER — AMMONIA AROMATIC IN INHA
RESPIRATORY_TRACT | Status: AC
  Filled 2018-05-12: qty 10

## 2018-05-12 MED ORDER — OXYTOCIN 40 UNITS IN NORMAL SALINE INFUSION - SIMPLE MED
INTRAVENOUS | Status: AC
  Filled 2018-05-12: qty 1000

## 2018-05-12 MED ORDER — OXYTOCIN 40 UNITS IN NORMAL SALINE INFUSION - SIMPLE MED: 2.5000 [IU]/h | INTRAVENOUS | Status: DC

## 2018-05-12 MED ORDER — OXYTOCIN 10 UNIT/ML IJ SOLN
INTRAMUSCULAR | Status: AC
  Filled 2018-05-12: qty 2

## 2018-05-12 MED ORDER — ACETAMINOPHEN 325 MG PO TABS: 650.0000 mg | ORAL_TABLET | ORAL | Status: DC | PRN

## 2018-05-12 MED ORDER — LACTATED RINGERS IV SOLN
500.0000 mL | INTRAVENOUS | Status: DC | PRN
  Administered 2018-05-13: 11:00:00 500 mL via INTRAVENOUS

## 2018-05-12 MED ORDER — ONDANSETRON HCL 4 MG/2ML IJ SOLN: 4.0000 mg | Freq: Four times a day (QID) | INTRAMUSCULAR | Status: DC | PRN

## 2018-05-12 NOTE — OB Triage Note (Signed)
Pt is a G2P0 at 40wk0d seen in triage for ctx. Pt states "her ctx started around 2020 after ingesting castor oil and were around 3-5 minutes apat for 10 minutes." Pt rates her pain 6/10. Pt denies LOF and vaginal bleeding. Pt states positive fetal movement. FHT 142. Monitors applied and assessing.

## 2018-05-13 ENCOUNTER — Inpatient Hospital Stay: Payer: Medicaid Other | Admitting: Anesthesiology

## 2018-05-13 ENCOUNTER — Encounter: Payer: Self-pay | Admitting: Anesthesiology

## 2018-05-13 DIAGNOSIS — O480 Post-term pregnancy: Secondary | ICD-10-CM

## 2018-05-13 DIAGNOSIS — Z3A40 40 weeks gestation of pregnancy: Secondary | ICD-10-CM

## 2018-05-13 LAB — TYPE AND SCREEN
ABO/RH(D): O POS
Antibody Screen: NEGATIVE

## 2018-05-13 LAB — GROUP B STREP BY PCR: Group B strep by PCR: NEGATIVE

## 2018-05-13 MED ORDER — FENTANYL 2.5 MCG/ML W/ROPIVACAINE 0.15% IN NS 100 ML EPIDURAL (ARMC)
EPIDURAL | Status: AC
  Filled 2018-05-13: qty 100

## 2018-05-13 MED ORDER — DIBUCAINE (PERIANAL) 1 % EX OINT: 1.0000 "application " | TOPICAL_OINTMENT | CUTANEOUS | Status: DC | PRN

## 2018-05-13 MED ORDER — WITCH HAZEL-GLYCERIN EX PADS: 1.0000 "application " | MEDICATED_PAD | CUTANEOUS | Status: DC | PRN

## 2018-05-13 MED ORDER — DIPHENHYDRAMINE HCL 25 MG PO CAPS: 25.0000 mg | ORAL_CAPSULE | Freq: Four times a day (QID) | ORAL | Status: DC | PRN

## 2018-05-13 MED ORDER — BENZOCAINE-MENTHOL 20-0.5 % EX AERO: 1.0000 "application " | INHALATION_SPRAY | CUTANEOUS | Status: DC | PRN

## 2018-05-13 MED ORDER — LIDOCAINE-EPINEPHRINE (PF) 1.5 %-1:200000 IJ SOLN
INTRAMUSCULAR | Status: DC | PRN
  Administered 2018-05-13: 3 mL via PERINEURAL

## 2018-05-13 MED ORDER — LIDOCAINE HCL (PF) 1 % IJ SOLN
INTRAMUSCULAR | Status: DC | PRN
  Administered 2018-05-13: 3 mL

## 2018-05-13 MED ORDER — ZOLPIDEM TARTRATE 5 MG PO TABS: 5.0000 mg | ORAL_TABLET | Freq: Every evening | ORAL | Status: DC | PRN

## 2018-05-13 MED ORDER — IBUPROFEN 800 MG PO TABS
800.0000 mg | ORAL_TABLET | Freq: Four times a day (QID) | ORAL | Status: DC
  Administered 2018-05-13 (×2): 800 mg via ORAL
  Filled 2018-05-13 (×4): qty 1

## 2018-05-13 MED ORDER — EPHEDRINE 5 MG/ML INJ: 10.0000 mg | INTRAVENOUS | Status: DC | PRN

## 2018-05-13 MED ORDER — COCONUT OIL OIL: 1.0000 "application " | TOPICAL_OIL | Status: DC | PRN

## 2018-05-13 MED ORDER — BUPIVACAINE HCL (PF) 0.25 % IJ SOLN
INTRAMUSCULAR | Status: DC | PRN
  Administered 2018-05-13: 8 mL via EPIDURAL

## 2018-05-13 MED ORDER — OXYTOCIN 40 UNITS IN NORMAL SALINE INFUSION - SIMPLE MED
1.0000 m[IU]/min | INTRAVENOUS | Status: DC
  Administered 2018-05-13: 08:00:00 2 m[IU]/min via INTRAVENOUS

## 2018-05-13 MED ORDER — ACETAMINOPHEN 325 MG PO TABS: 650.0000 mg | ORAL_TABLET | ORAL | Status: DC | PRN

## 2018-05-13 MED ORDER — TERBUTALINE SULFATE 1 MG/ML IJ SOLN: 0.2500 mg | Freq: Once | INTRAMUSCULAR | Status: DC | PRN

## 2018-05-13 MED ORDER — DIPHENHYDRAMINE HCL 50 MG/ML IJ SOLN: 12.5000 mg | INTRAMUSCULAR | Status: DC | PRN

## 2018-05-13 MED ORDER — ONDANSETRON HCL 4 MG/2ML IJ SOLN: 4.0000 mg | INTRAMUSCULAR | Status: DC | PRN

## 2018-05-13 MED ORDER — FENTANYL 2.5 MCG/ML W/ROPIVACAINE 0.15% IN NS 100 ML EPIDURAL (ARMC)
12.0000 mL/h | EPIDURAL | Status: DC
  Administered 2018-05-13: 12:00:00 12 mL/h via EPIDURAL

## 2018-05-13 MED ORDER — ONDANSETRON HCL 4 MG PO TABS: 4.0000 mg | ORAL_TABLET | ORAL | Status: DC | PRN

## 2018-05-13 MED ORDER — PRENATAL MULTIVITAMIN CH
1.0000 | ORAL_TABLET | Freq: Every day | ORAL | Status: DC
  Filled 2018-05-13: qty 1

## 2018-05-13 MED ORDER — SENNOSIDES-DOCUSATE SODIUM 8.6-50 MG PO TABS
2.0000 | ORAL_TABLET | ORAL | Status: DC
  Administered 2018-05-13: 23:00:00 2 via ORAL
  Filled 2018-05-13: qty 2

## 2018-05-13 MED ORDER — PHENYLEPHRINE 40 MCG/ML (10ML) SYRINGE FOR IV PUSH (FOR BLOOD PRESSURE SUPPORT): 80.0000 ug | PREFILLED_SYRINGE | INTRAVENOUS | Status: DC | PRN

## 2018-05-13 MED ORDER — LACTATED RINGERS IV SOLN: 500.0000 mL | Freq: Once | INTRAVENOUS | Status: DC

## 2018-05-13 MED ORDER — SIMETHICONE 80 MG PO CHEW: 80.0000 mg | CHEWABLE_TABLET | ORAL | Status: DC | PRN

## 2018-05-13 NOTE — Anesthesia Preprocedure Evaluation (Addendum)
Anesthesia Evaluation  Patient identified by MRN, date of birth, ID band Patient awake    Reviewed: Allergy & Precautions, NPO status , Patient's Chart, lab work & pertinent test results  Airway Mallampati: II  TM Distance: >3 FB     Dental no notable dental hx.    Pulmonary former smoker,    Pulmonary exam normal        Cardiovascular negative cardio ROS Normal cardiovascular exam     Neuro/Psych negative neurological ROS  negative psych ROS   GI/Hepatic negative GI ROS, Neg liver ROS,   Endo/Other  negative endocrine ROS  Renal/GU negative Renal ROS  negative genitourinary   Musculoskeletal negative musculoskeletal ROS (+)   Abdominal Normal abdominal exam  (+)   Peds negative pediatric ROS (+)  Hematology negative hematology ROS (+)   Anesthesia Other Findings   Reproductive/Obstetrics (+) Pregnancy                             Anesthesia Physical Anesthesia Plan  ASA: II  Anesthesia Plan: Epidural   Post-op Pain Management:    Induction:   PONV Risk Score and Plan:   Airway Management Planned: Natural Airway  Additional Equipment:   Intra-op Plan:   Post-operative Plan:   Informed Consent: I have reviewed the patients History and Physical, chart, labs and discussed the procedure including the risks, benefits and alternatives for the proposed anesthesia with the patient or authorized representative who has indicated his/her understanding and acceptance.     Dental advisory given  Plan Discussed with: CRNA and Surgeon  Anesthesia Plan Comments:         Anesthesia Quick Evaluation

## 2018-05-13 NOTE — Progress Notes (Signed)
Intrapartum Progress Note  Received care from Dr. Brennan Bailey of [facility].  S: Patient complained of painful contractions, just received epidural.  O: Blood pressure 124/68, pulse 61, temperature 98.1 F (36.7 C), temperature source Oral, resp. rate 18, height 5\' 6"  (1.676 m), weight 96.2 kg. Gen App: NAD, now comfortable Abdomen: soft, gravid FHT: baseline 140 bpm.  Accels present.  Decels absent. moderate in degree variability.   Tocometer: contractions q 2-3 minutes Cervix: 8/0/100 Extremities: Nontender, no edema.  Pitocin: 4 mIU  Labs:  Results for orders placed or performed during the hospital encounter of 05/12/18  Group B strep by PCR  Result Value Ref Range   Group B strep by PCR NEGATIVE NEGATIVE  CBC with Differential/Platelet  Result Value Ref Range   WBC 10.4 4.0 - 10.5 K/uL   RBC 4.09 3.87 - 5.11 MIL/uL   Hemoglobin 11.9 (L) 12.0 - 15.0 g/dL   HCT 35.5 (L) 97.4 - 16.3 %   MCV 87.0 80.0 - 100.0 fL   MCH 29.1 26.0 - 34.0 pg   MCHC 33.4 30.0 - 36.0 g/dL   RDW 84.5 36.4 - 68.0 %   Platelets 183 150 - 400 K/uL   nRBC 0.0 0.0 - 0.2 %   Neutrophils Relative % 64 %   Neutro Abs 6.8 1.7 - 7.7 K/uL   Lymphocytes Relative 26 %   Lymphs Abs 2.7 0.7 - 4.0 K/uL   Monocytes Relative 8 %   Monocytes Absolute 0.8 0.1 - 1.0 K/uL   Eosinophils Relative 1 %   Eosinophils Absolute 0.1 0.0 - 0.5 K/uL   Basophils Relative 0 %   Basophils Absolute 0.0 0.0 - 0.1 K/uL   Immature Granulocytes 1 %   Abs Immature Granulocytes 0.05 0.00 - 0.07 K/uL  OB RESULTS CONSOLE GC/Chlamydia  Result Value Ref Range   Gonorrhea Negative    Chlamydia Negative   OB RESULTS CONSOLE RPR  Result Value Ref Range   RPR Nonreactive   OB RESULTS CONSOLE HIV antibody  Result Value Ref Range   HIV Non-reactive   OB RESULTS CONSOLE Hepatitis B surface antigen  Result Value Ref Range   Hepatitis B Surface Ag Negative   Type and screen [facility]  Result Value Ref Range   ABO/RH(D) O POS    Antibody Screen NEG    Sample Expiration      05/15/2018,2359 Performed at [facility], [address] [place], [place] [address]       Assessment:  1: SIUP at [redacted]w[redacted]d 2. Active labor  Plan:  1. Anticipate delivery soon.   Hildred Laser, MD 05/13/2018 11:35 AM

## 2018-05-13 NOTE — Anesthesia Procedure Notes (Signed)
Epidural Patient location during procedure: OB Start time: 05/13/2018 10:52 AM End time: 05/13/2018 11:06 AM  Staffing Anesthesiologist: Yves Dill, MD Performed: anesthesiologist   Preanesthetic Checklist Completed: patient identified, site marked, surgical consent, pre-op evaluation, timeout performed, IV checked, risks and benefits discussed and monitors and equipment checked  Epidural Patient position: sitting Prep: Betadine Patient monitoring: heart rate, continuous pulse ox and blood pressure Approach: midline Location: L4-L5 Injection technique: LOR air  Needle:  Needle type: Tuohy  Needle gauge: 18 G Needle length: 9 cm and 9 Catheter type: closed end flexible Catheter size: 19 Gauge Test dose: negative and 1.5% lidocaine with Epi 1:200 K  Assessment Sensory level: T8 Events: blood not aspirated, injection not painful, no injection resistance, negative IV test and no paresthesia  Additional Notes Time out called.  Patient fairly mobile.  The back was prepped and draped in sterile fashion.  A skin wheal was made in the L3-L4 interspace with 1% Lidocaine plain.  The 17G Tuohy needle was advanced into the e(pidural space by a loss of resistance technique.  No blood or paresthesias.  The patient tolerated the procedure well and the TD was negative.Reason for block:procedure for pain

## 2018-05-13 NOTE — Progress Notes (Signed)
Intrapartum Progress Note  S: Patient comfortable, no complaints.  O: Blood pressure 124/68, pulse 61, temperature 98.1 F (36.7 C), temperature source Oral, resp. rate 18, height 5\' 6"  (1.676 m), weight 96.2 kg. Gen App: NAD, now comfortable Abdomen: soft, gravid FHT: baseline 140 bpm.  Accels present.  Decels absent. moderate in degree variability.   Tocometer: contractions q 2-3 minutes Cervix: 9.5/100/+2 Extremities: Nontender, no edema.  Pitocin: 4 mIU  Labs:  No new labs   Assessment:  1: SIUP at [redacted]w[redacted]d 2. Active labor  Plan:  1. Anticipate delivery soon.   Hildred Laser, MD 05/13/2018 1:02 PM

## 2018-05-13 NOTE — H&P (Signed)
History and Physical   HPI  Christie Green is a 23 y.o. G2P1001 at 1627w1d Estimated Date of Delivery: 05/12/18 who is being admitted for  labor management.  Pt presented by EMS with c/o ctx.  She underwent cervical change from 3-4cm in 1.5 hours and was admitted for labor.   OB History  OB History  Gravida Para Term Preterm AB Living  2 1 1  0 0 1  SAB TAB Ectopic Multiple Live Births  0 0 0 0 1    # Outcome Date GA Lbr Len/2nd Weight Sex Delivery Anes PTL Lv  2 Current           1 Term             PROBLEM LIST  Pregnancy complications or risks: Patient Active Problem List   Diagnosis Date Noted  . Bleeding 02/19/2018  . Amniotic fluid leaking 01/07/2018  . Normal labor 11/30/2015  . Braxton Hick's contraction 11/29/2015  . Abdominal pain affecting pregnancy 11/29/2015  . Irregular contractions 11/15/2015  . UTI in pregnancy, antepartum, third trimester 11/05/2015  . Preterm uterine contractions 11/05/2015  . Pregnancy 10/30/2015     Prenatal labs and studies: ABO, Rh: --/--/O POS (05/05 2328) Antibody: NEG (05/05 2328) Rubella:   RPR: Nonreactive (01/17 0000)  HBsAg: Negative (01/17 0000)  HIV: Non-reactive (01/16 0000)  GBS:    Past Medical History:  Diagnosis Date  . UTI (urinary tract infection) 09/2015     History reviewed. No pertinent surgical history.   Medications    Current Discharge Medication List    CONTINUE these medications which have NOT CHANGED   Details  Prenatal Vit-Fe Fumarate-FA (PRENATAL VITAMIN PLUS LOW IRON) 27-1 MG TABS Take 1 tablet by mouth every morning. Qty: 30 tablet, Refills: 7         Allergies  Patient has no known allergies.  Review of Systems  Pertinent items are noted in HPI.  Physical Exam  BP 130/63   Pulse 60   Temp 98.9 F (37.2 C) (Oral)   Resp 16   Ht 5\' 6"  (1.676 m)   Wt 96.2 kg   BMI 34.22 kg/m   Lungs:  CTA B Cardio: RRR without M/R/G Abd: Soft, gravid, NT Presentation:  cephalic EXT: No C/C/ 1+ Edema DTRs: 2+ B CERVIX: Dilation: 4 Effacement (%): 50 Cervical Position: Middle Station: -3 Presentation: Vertex Exam by:: Logan BoresEvans, MD AROM - clear fluid  See Prenatal records for more detailed PE.    FHR:  Variability: Good {> 6 bpm)  Toco: Uterine Contractions: they have now become irregular since admission.   Test Results  Results for orders placed or performed during the hospital encounter of 05/12/18 (from the past 24 hour(s))  CBC with Differential/Platelet     Status: Abnormal   Collection Time: 05/12/18 11:28 PM  Result Value Ref Range   WBC 10.4 4.0 - 10.5 K/uL   RBC 4.09 3.87 - 5.11 MIL/uL   Hemoglobin 11.9 (L) 12.0 - 15.0 g/dL   HCT 16.135.6 (L) 09.636.0 - 04.546.0 %   MCV 87.0 80.0 - 100.0 fL   MCH 29.1 26.0 - 34.0 pg   MCHC 33.4 30.0 - 36.0 g/dL   RDW 40.913.3 81.111.5 - 91.415.5 %   Platelets 183 150 - 400 K/uL   nRBC 0.0 0.0 - 0.2 %   Neutrophils Relative % 64 %   Neutro Abs 6.8 1.7 - 7.7 K/uL   Lymphocytes Relative 26 %   Lymphs  Abs 2.7 0.7 - 4.0 K/uL   Monocytes Relative 8 %   Monocytes Absolute 0.8 0.1 - 1.0 K/uL   Eosinophils Relative 1 %   Eosinophils Absolute 0.1 0.0 - 0.5 K/uL   Basophils Relative 0 %   Basophils Absolute 0.0 0.0 - 0.1 K/uL   Immature Granulocytes 1 %   Abs Immature Granulocytes 0.05 0.00 - 0.07 K/uL  Type and screen [facility]     Status: None   Collection Time: 05/12/18 11:28 PM  Result Value Ref Range   ABO/RH(D) O POS    Antibody Screen NEG    Sample Expiration      05/15/2018,2359 Performed at [facility], [address] [place], [place] [address]   Group B strep by PCR     Status: None   Collection Time: 05/13/18 12:07 AM  Result Value Ref Range   Group B strep by PCR NEGATIVE NEGATIVE     Assessment   G2P1001 at [redacted]w[redacted]d Estimated Date of Delivery: 05/12/18  The fetus is reassuring.  Pt not having adequate contractions.  Patient Active Problem List   Diagnosis Date Noted   . Bleeding 02/19/2018  . Amniotic fluid leaking 01/07/2018  . Normal labor 11/30/2015  . Braxton Hick's contraction 11/29/2015  . Abdominal pain affecting pregnancy 11/29/2015  . Irregular contractions 11/15/2015  . UTI in pregnancy, antepartum, third trimester 11/05/2015  . Preterm uterine contractions 11/05/2015  . Pregnancy 10/30/2015    Plan  1. Admit to L&D :    2. EFM: -- Category 1 3. Epidural if desired.  Stadol for IV pain until epidural requested. 4. Admission labs  5. Begin Pitocin augmentation of labor.  Elonda Husky, M.D. 05/13/2018 7:27 AM

## 2018-05-14 LAB — CBC
HCT: 30.2 % — ABNORMAL LOW (ref 36.0–46.0)
Hemoglobin: 9.8 g/dL — ABNORMAL LOW (ref 12.0–15.0)
MCH: 28.6 pg (ref 26.0–34.0)
MCHC: 32.5 g/dL (ref 30.0–36.0)
MCV: 88 fL (ref 80.0–100.0)
Platelets: 162 10*3/uL (ref 150–400)
RBC: 3.43 MIL/uL — ABNORMAL LOW (ref 3.87–5.11)
RDW: 13.4 % (ref 11.5–15.5)
WBC: 11.4 10*3/uL — ABNORMAL HIGH (ref 4.0–10.5)
nRBC: 0 % (ref 0.0–0.2)

## 2018-05-14 LAB — RPR: RPR Ser Ql: NONREACTIVE

## 2018-05-14 MED ORDER — MEDROXYPROGESTERONE ACETATE 150 MG/ML IM SUSP
150.0000 mg | Freq: Once | INTRAMUSCULAR | Status: AC
  Administered 2018-05-14: 17:00:00 150 mg via INTRAMUSCULAR
  Filled 2018-05-14: qty 1

## 2018-05-14 MED ORDER — IBUPROFEN 800 MG PO TABS: 800.0000 mg | ORAL_TABLET | Freq: Three times a day (TID) | ORAL | 1 refills | Status: DC | PRN

## 2018-05-14 NOTE — Anesthesia Postprocedure Evaluation (Signed)
Anesthesia Post Note  Patient: Christie Green  Procedure(s) Performed: AN AD HOC LABOR EPIDURAL  Patient location during evaluation: Mother Baby Anesthesia Type: Epidural Level of consciousness: awake and alert Pain management: pain level controlled Vital Signs Assessment: post-procedure vital signs reviewed and stable Respiratory status: spontaneous breathing, nonlabored ventilation and respiratory function stable Cardiovascular status: stable Postop Assessment: no headache, no backache, epidural receding and able to ambulate Anesthetic complications: no     Last Vitals:  Vitals:   05/14/18 0417 05/14/18 0809  BP: 114/62 (!) 100/55  Pulse: (!) 56 60  Resp: 18 18  Temp: 36.6 C 36.8 C  SpO2: 100% 99%    Last Pain:  Vitals:   05/14/18 0809  TempSrc: Axillary  PainSc:                  Karoline Caldwell

## 2018-05-14 NOTE — Discharge Instructions (Signed)
Please call your doctor or return to the ER if you experience any chest pains, shortness of breath, dizziness, visual changes, fever greater than 101, any heavy bleeding (saturating more than 1 pad per hour), large clots, or foul smelling discharge, any worsening abdominal pain and cramping that is not controlled by pain medication, or any signs of postpartum depression. No tampons, enemas, douches, or sexual intercourse for 6 weeks. Also avoid tub baths, hot tubs, or swimming for 6 weeks.  °

## 2018-05-14 NOTE — Addendum Note (Signed)
Addendum  created 05/14/18 1000 by Yves Dill, MD   Clinical Note Signed

## 2018-05-14 NOTE — Progress Notes (Signed)
Discharge order received from doctor. Social work consult complete and patient and infant cleared for discharge. Depo given prior to discharge. Reviewed discharge instructions and prescriptions with patient and answered all questions. Follow up appointment instructions given. Patient verbalized understanding. ID bands checked. Patient discharged home with infant via wheelchair by nursing/auxillary.    Oswald Hillock, RN
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
## 2018-05-14 NOTE — Discharge Summary (Signed)
                              Discharge Summary  Date of Admission: 05/12/2018  Date of Discharge: 05/14/2018  Admitting Diagnosis: Onset of Labor at [redacted]w[redacted]d  Mode of Delivery: normal spontaneous vaginal delivery                 Discharge Diagnosis: No other diagnosis   Intrapartum Procedures: Atificial rupture of membranes, epidural and pitocin augmentation   Post partum procedures:   Complications: none                      Discharge Day SOAP Note:  Progress Note - Vaginal Delivery  Christie Green is a 23 y.o. G2P2002 now PP day 1 s/p Vaginal, Spontaneous . Delivery was uncomplicated  Subjective  The patient has the following complaints: has no unusual complaints  Pain is controlled with current medications.   Patient is urinating without difficulty.  She is ambulating well.   She is requesting discharge today  Objective  Vital signs: BP (!) 100/55 (BP Location: Left Arm)   Pulse 60   Temp 98.2 F (36.8 C) (Axillary)   Resp 18   Ht 5\' 6"  (1.676 m)   Wt 96.2 kg   SpO2 99%   Breastfeeding Unknown   BMI 34.22 kg/m   Physical Exam: Gen: NAD Fundus Fundal Tone: Firm  Lochia Amount: Small  Perineum Appearance: Intact     Data Review Labs: CBC Latest Ref Rng & Units 05/14/2018 05/12/2018 12/03/2017  WBC 4.0 - 10.5 K/uL 11.4(H) 10.4 8.3  Hemoglobin 12.0 - 15.0 g/dL 3.7(C) 11.9(L) 12.5  Hematocrit 36.0 - 46.0 % 30.2(L) 35.6(L) 37.5  Platelets 150 - 400 K/uL 162 183 199   O POS  Assessment/Plan  Active Problems:   Normal labor    Plan for discharge today.   Discharge Instructions: Per After Visit Summary. Activity: Advance as tolerated. Pelvic rest for 6 weeks.  Also refer to After Visit Summary Diet: Regular Medications: Allergies as of 05/14/2018   No Known Allergies     Medication List    TAKE these medications   Prenatal Vitamin Plus Low Iron 27-1 MG Tabs Take 1 tablet by mouth every morning.      Outpatient follow up:  Follow-up Information     Department, [facility] Follow up in 6 week(s).   Contact information: [address] [place] [address] [phone]          Postpartum contraception: Will discuss at first office visit post-partum  Discharged Condition: good  Discharged to: home  Newborn Data: Disposition:home with mother  Apgars: APGAR (1 MIN): 8   APGAR (5 MINS): 9   APGAR (10 MINS):    Baby Feeding: Bottle    Elonda Husky, M.D. 05/14/2018 10:35 AM

## 2018-08-14 DIAGNOSIS — IMO0002 Reserved for concepts with insufficient information to code with codable children: Secondary | ICD-10-CM

## 2018-08-14 DIAGNOSIS — Z915 Personal history of self-harm: Secondary | ICD-10-CM

## 2018-08-14 DIAGNOSIS — Z8659 Personal history of other mental and behavioral disorders: Secondary | ICD-10-CM

## 2018-08-14 DIAGNOSIS — R443 Hallucinations, unspecified: Secondary | ICD-10-CM

## 2018-08-14 DIAGNOSIS — E669 Obesity, unspecified: Secondary | ICD-10-CM

## 2018-08-17 ENCOUNTER — Other Ambulatory Visit: Payer: Self-pay

## 2018-08-17 ENCOUNTER — Ambulatory Visit (LOCAL_COMMUNITY_HEALTH_CENTER): Payer: Medicaid Other

## 2018-08-17 VITALS — BP 109/75 | Ht 66.0 in | Wt 264.0 lb

## 2018-08-17 DIAGNOSIS — Z3009 Encounter for other general counseling and advice on contraception: Secondary | ICD-10-CM | POA: Diagnosis not present

## 2018-08-17 DIAGNOSIS — Z30013 Encounter for initial prescription of injectable contraceptive: Secondary | ICD-10-CM | POA: Diagnosis not present

## 2018-08-17 DIAGNOSIS — Z3042 Encounter for surveillance of injectable contraceptive: Secondary | ICD-10-CM

## 2018-08-17 MED ORDER — THERA VITAL M PO TABS: 1.0000 | ORAL_TABLET | Freq: Every day | ORAL | 0 refills | Status: DC

## 2018-08-17 MED ORDER — MEDROXYPROGESTERONE ACETATE 150 MG/ML IM SUSP
150.0000 mg | Freq: Once | INTRAMUSCULAR | Status: AC
  Administered 2018-08-17: 16:00:00 150 mg via INTRAMUSCULAR

## 2018-08-17 NOTE — Progress Notes (Signed)
   [place] problem visit  [facility] Department  Subjective:  Christie Green is a 23 y.o. being seen today for   Chief Complaint  Patient presents with  . Postpartum Care    Client here for post partum exam and continuation of Depo.  States that she delivered 05/14/2018.  Received her Depo in the hospital.  She denies concerns or problems post-partum.  States  She is in the process of making counseling appts for her h/o of depression with [facility].    Does the patient have a current or past history of drug use? No   No components found for: HCV]   Health Maintenance Due  Topic Date Due  . CHLAMYDIA SCREENING  06/30/2011  . TETANUS/TDAP  06/30/2015  . INFLUENZA VACCINE  08/08/2018    Review of Systems  All other systems reviewed and are negative.   The following portions of the patient's history were reviewed and updated as appropriate: allergies, current medications, past family history, past medical history, past social history, past surgical history and problem list. Problem list updated.   See flowsheet for other program required questions.  Objective:  There were no vitals filed for this visit.  Physical Exam not indicated d/t  Clinic not performing PE with covid 19    Assessment and Plan:  Christie Green is a 24 y.o. female presenting to the [facility] Department for a Women's Health problem visit  -Family Planning Depo Provera 150 mg IM q 11-13 wks x 1 dose.  Client to call for next Depo and to ask if clinic is doing exams.     No follow-ups on file.  No future appointments.  Hassell Done, FNP

## 2018-08-17 NOTE — Progress Notes (Signed)
Depo given, left deltoid, tolerated well, next Depo card given.Dustin Bumbaugh Brewer-Jensen, RN 

## 2018-08-17 NOTE — Progress Notes (Signed)
Patient here for PP exam and Depo. Had Depo in hospital after birth of daughter, about 05/14/2018.Marland KitchenJenetta Downer, RN

## 2018-08-27 ENCOUNTER — Other Ambulatory Visit: Payer: Self-pay | Admitting: Family Medicine

## 2018-08-27 DIAGNOSIS — Z113 Encounter for screening for infections with a predominantly sexual mode of transmission: Secondary | ICD-10-CM

## 2018-09-18 IMAGING — US US OB COMP +14 WK
1 series · 13 of 28 positions shown · non-contrast
Comparison: none

CLINICAL DATA: Anatomic survey ; laden treated to care

EXAM:
OBSTETRICAL ULTRASOUND >14 WKS

[Series 1: us ob comp +14 wk · 0.25mm/px · 13 of 86 slices shown]
[im 4/86]
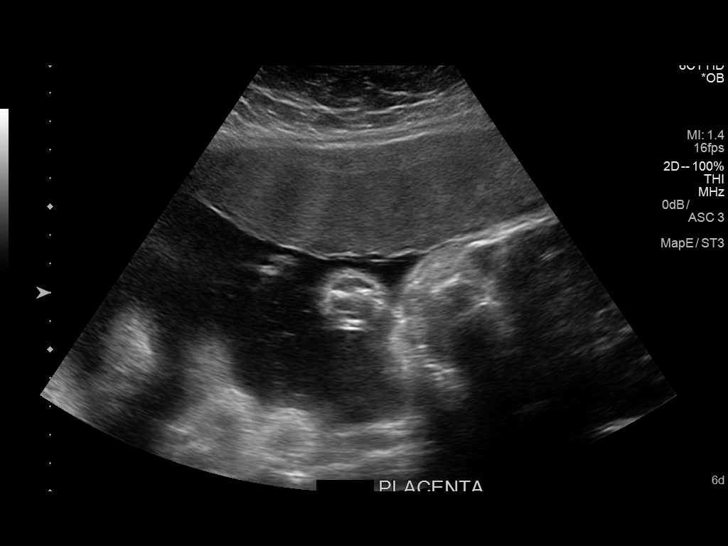
[im 10/86]
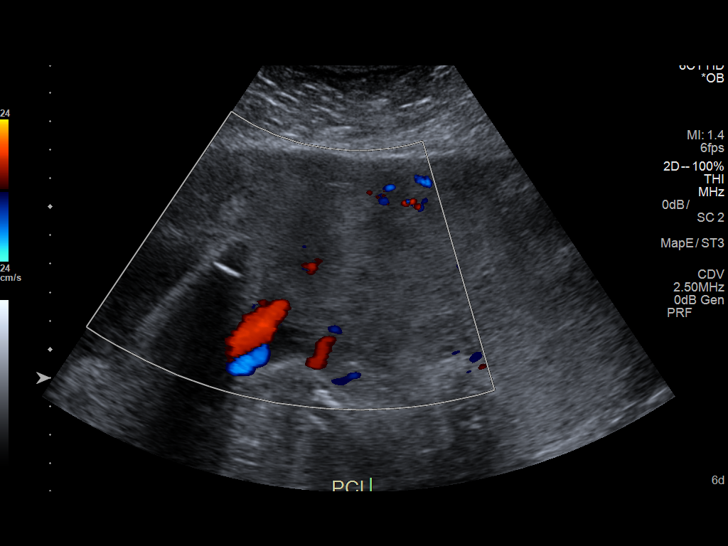
[im 16/86]
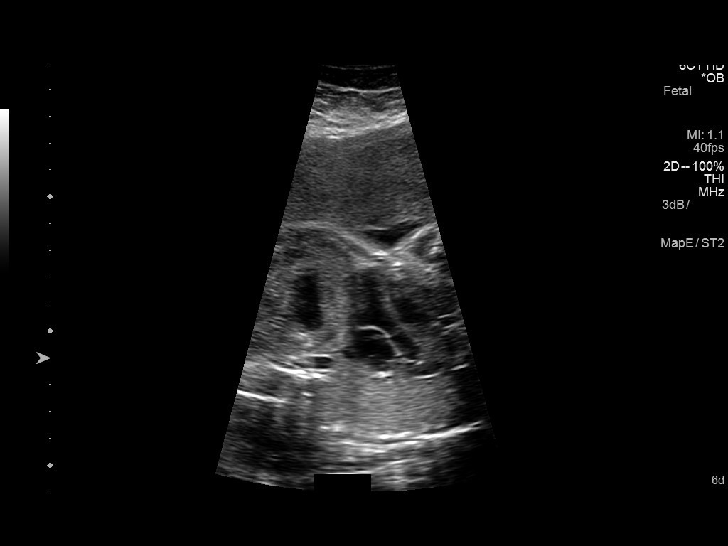
[im 23/86]
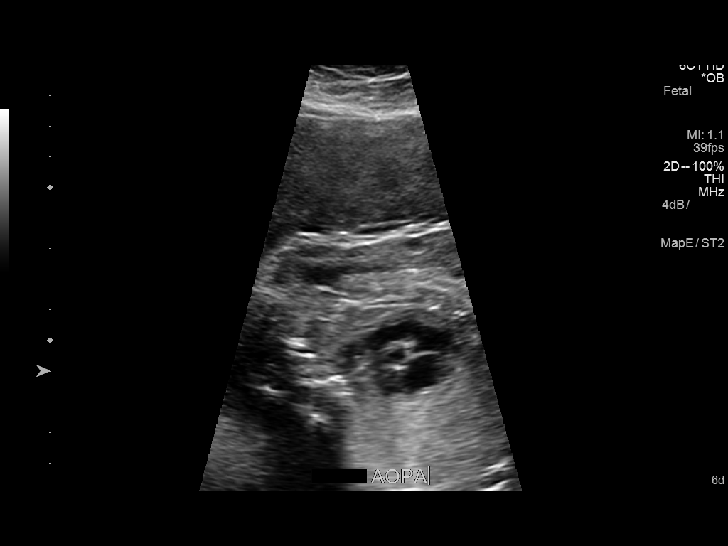
[im 29/86]
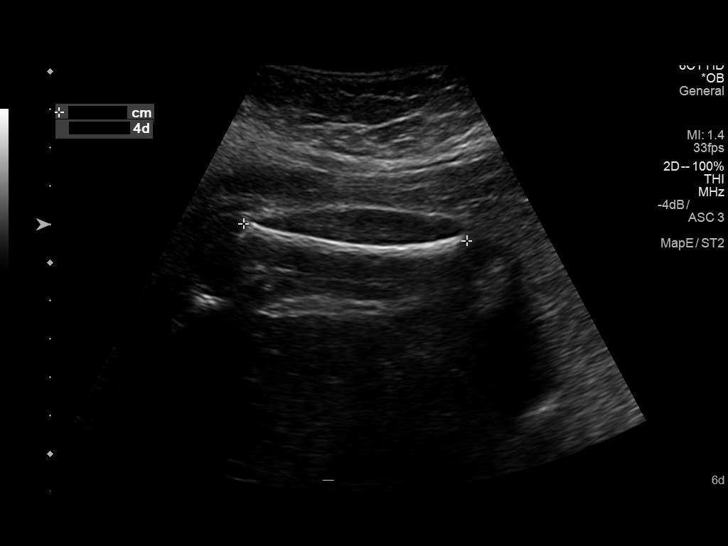
[im 35/86]
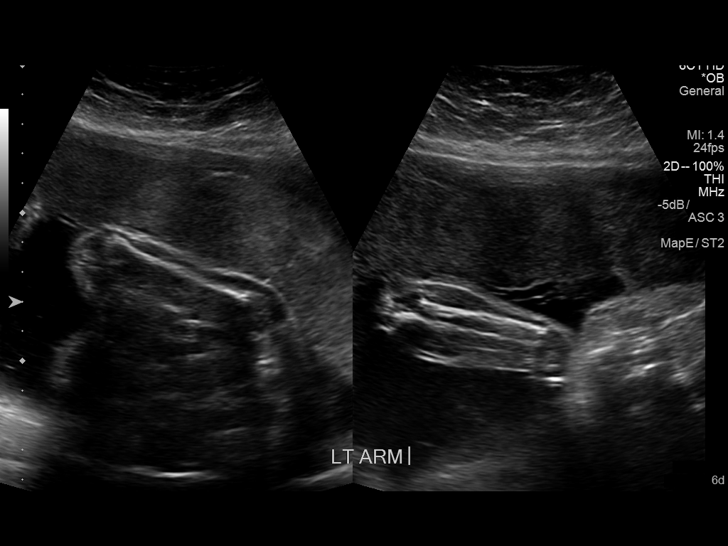
[im 45/86]
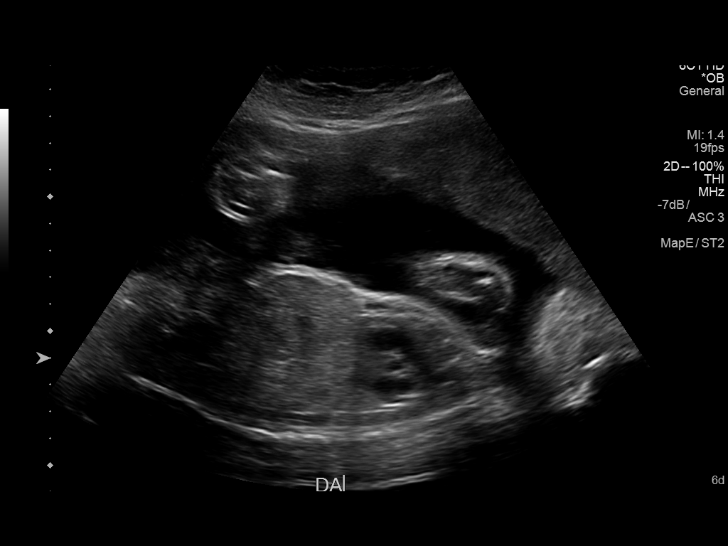
[im 51/86]
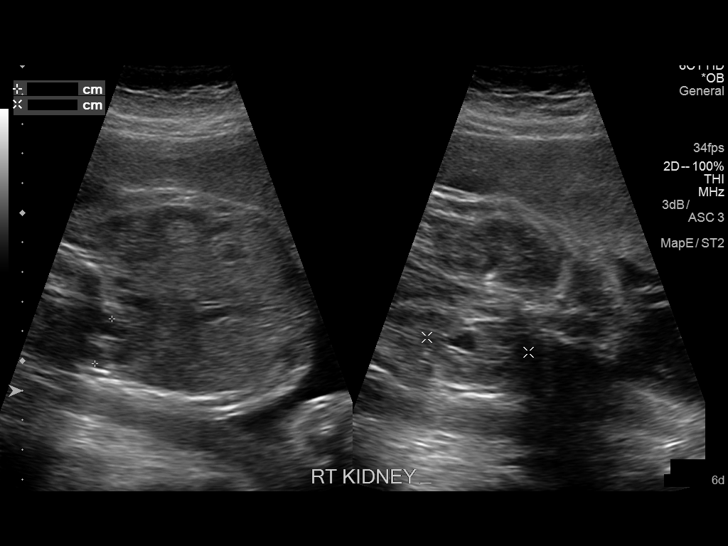
[im 57/86]
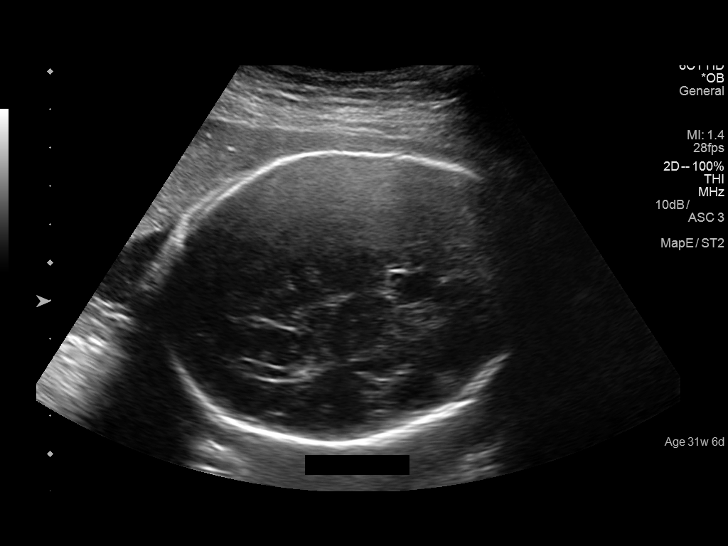
[im 63/86]
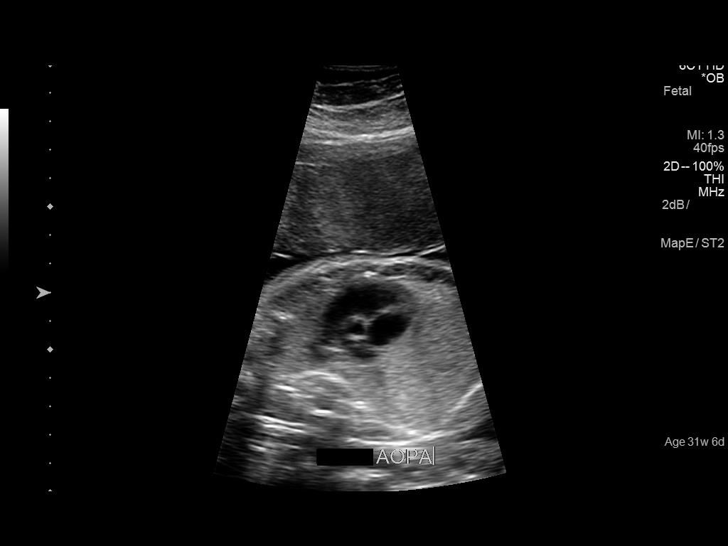
[im 70/86]
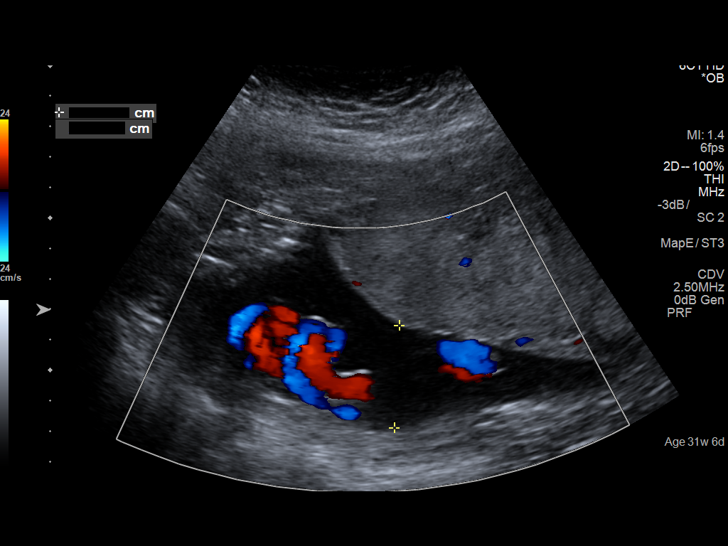
[im 76/86]
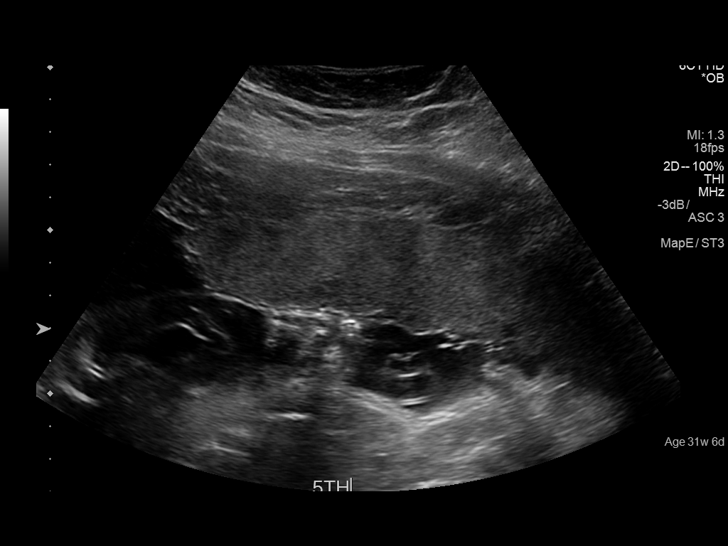
[im 82/86]
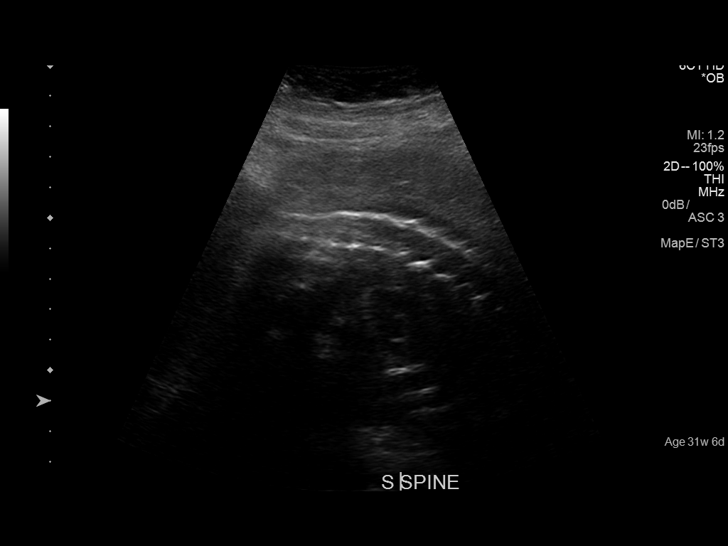

[13 of 28 positions shown; findings below may reference images not displayed]

FINDINGS: Number of Fetuses: 1

Heart Rate:  152 bpm

Movement: Present

Presentation: Cephalic

Previa: None. The distance from the lower placental segment to the
internal cervical os is 7.2 cm.

Placental Location: Anterior and partially fundal

Amniotic Fluid (Subjective): Normal

Amniotic Fluid (Objective):

Vertical pocket 5 cmcm

AFI 13.2 cm (5%ile= 8.8 cm, 95%= 23.8 cm for 31 wks)

FETAL BIOMETRY

BPD:  7.4cm 29w 6d

HC:    27.96cm  30w   4d

AC:   26.2cm  30w   3d

FL:   5.8cm  30w   3d

Current Mean GA: 30w 3d              US EDC: December 09, 2015

Estimated Fetal Weight:  1557g    7%ile
No previous ultrasounds for review.

FETAL ANATOMY

Lateral Ventricles: Visualize

Thalami/CSP: Visualize

Posterior Fossa:  Visualized

Upper Lip: Visualized

Spine: Limited visualization

4 Chamber Heart on Left: Visualized

LVOT: Visualized

RVOT: Visualized

Stomach on Left: Visualized

3 Vessel Cord: Visualized

Cord Insertion site: Visualized

Kidneys: Visualized

Bladder: Visualized

Extremities: Visualized

Sex: Male genitalia

Technically difficult due to: Fetal position and maternal body
habitus which limited visualization of the spine

Maternal Findings:

Cervix:  4.2 cm in length, closed.
IMPRESSION: Single viable IUP with estimated gestational age of 30 weeks 3 days
with estimated date of confinement December 09, 2015. The
estimated fetal weight 9115 g corresponds to the seventh percentile
for 30 week gestation. No fetal anomalies are observed.

The placenta is anterior and partially fundal with no evidence of
previa. The amniotic fluid is estimated to be normal with an AFI of
13.2 cm.

## 2018-09-29 NOTE — Addendum Note (Signed)
Addended by: Caryl Bis on: 09/29/2018 02:59 PM   Modules accepted: Orders

## 2019-03-29 ENCOUNTER — Other Ambulatory Visit: Payer: Self-pay

## 2019-03-29 ENCOUNTER — Ambulatory Visit (LOCAL_COMMUNITY_HEALTH_CENTER): Payer: Medicaid Other

## 2019-03-29 VITALS — BP 100/64 | Ht 66.0 in | Wt 254.0 lb

## 2019-03-29 DIAGNOSIS — Z3202 Encounter for pregnancy test, result negative: Secondary | ICD-10-CM

## 2019-03-29 DIAGNOSIS — Z3009 Encounter for other general counseling and advice on contraception: Secondary | ICD-10-CM

## 2019-03-29 LAB — PREGNANCY, URINE: Preg Test, Ur: NEGATIVE

## 2019-03-29 MED ORDER — MULTIVITAMINS PO CAPS: 1.0000 | ORAL_CAPSULE | Freq: Every day | ORAL | 0 refills | Status: DC

## 2019-03-30 ENCOUNTER — Ambulatory Visit: Payer: Medicaid Other

## 2019-06-14 ENCOUNTER — Other Ambulatory Visit: Payer: Self-pay

## 2019-06-14 ENCOUNTER — Encounter: Payer: Self-pay | Admitting: Physician Assistant

## 2019-06-14 ENCOUNTER — Ambulatory Visit: Payer: Medicaid Other | Admitting: Physician Assistant

## 2019-06-14 DIAGNOSIS — A5901 Trichomonal vulvovaginitis: Secondary | ICD-10-CM

## 2019-06-14 DIAGNOSIS — Z113 Encounter for screening for infections with a predominantly sexual mode of transmission: Secondary | ICD-10-CM | POA: Diagnosis not present

## 2019-06-14 LAB — WET PREP FOR TRICH, YEAST, CLUE
Trichomonas Exam: POSITIVE — AB
Yeast Exam: NEGATIVE

## 2019-06-14 LAB — PREGNANCY, URINE: Preg Test, Ur: NEGATIVE

## 2019-06-14 MED ORDER — METRONIDAZOLE 500 MG PO TABS: 2000.0000 mg | ORAL_TABLET | Freq: Once | ORAL | 0 refills | Status: AC

## 2019-06-14 NOTE — Progress Notes (Signed)
[facility] Department STI clinic/screening visit  Subjective:  Christie Green is a 24 y.o. female being seen today for an STI screening visit. The patient reports they do not have symptoms.  Patient reports that they would be OK if a pregnancy were to occur  in the next year.   They reported they are not interested in discussing contraception today.  No LMP recorded.   Patient has the following medical conditions:   Patient Active Problem List   Diagnosis Date Noted  . Hallucinations 01/22/2018  . History of depression 01/22/2018  . Obesity, unspecified 01/22/2018  . History of self-harm 06/30/2010    Chief Complaint  Patient presents with  . SEXUALLY TRANSMITTED DISEASE    screening    HPI  Patient reports that she has not had any symptoms but would like a screening today.  States last period was at the beginning of April and heavy but that since she had her baby a little over 1 year ago, her periods have been irregular.   Takes OTC iron pills and last HIV test was 6-12 months ago.  Is not sure when last pap was done but was less than 3 years ago.  See flowsheet for further details and programmatic requirements.    The following portions of the patient's history were reviewed and updated as appropriate: allergies, current medications, past medical history, past social history, past surgical history and problem list.  Objective:  There were no vitals filed for this visit.  Physical Exam Constitutional:      General: She is not in acute distress.    Appearance: Normal appearance.  HENT:     Head: Normocephalic and atraumatic.     Comments: No nits, lice or hair loss. No cervical, supraclavicular or axillary adenopathy.    Mouth/Throat:     Mouth: Mucous membranes are moist.     Pharynx: Oropharynx is clear. No oropharyngeal exudate or posterior oropharyngeal erythema.  Eyes:     Conjunctiva/sclera: Conjunctivae normal.  Pulmonary:     Effort: Pulmonary  effort is normal.  Musculoskeletal:     Cervical back: Neck supple. No tenderness.  Skin:    General: Skin is warm and dry.     Findings: No bruising, erythema, lesion or rash.  Neurological:     Mental Status: She is alert and oriented to person, place, and time.  Psychiatric:        Mood and Affect: Mood normal.        Behavior: Behavior normal.        Thought Content: Thought content normal.        Judgment: Judgment normal.    Patient desires to self-collect vaginal samples today.   Assessment and Plan:  Christie Green is a 24 y.o. female presenting to the [facility] Department for STI screening  1. Screening for STD (sexually transmitted disease) Patient into clinic without symptoms.   Rec condoms with all sex Await test results.  Counseled that RN will call if needs to RTC for further treatment once results are back. - WET PREP FOR TRICH, YEAST, CLUE - Gonococcus culture - Chlamydia/Gonorrhea [place] [facility] - HIV [place] [facility] - Syphilis Serology,  Lab - Pregnancy, urine  2. Trichomonal vaginitis Will treat for Trich with Metronidazole 2 g po with food, no EtOH for 14 hr before and until 72 hr after taking medicine. No sex for 7 days and until after partner completes treatment. RTC for re-treatment if vomits <  2 hr after taking medicine. - metroNIDAZOLE (FLAGYL) 500 MG tablet; Take 4 tablets (2,000 mg total) by mouth once for 1 dose.  Dispense: 4 tablet; Refill: 0     No follow-ups on file.  No future appointments.  Christie Holmes, PA

## 2019-06-17 ENCOUNTER — Telehealth: Payer: Self-pay | Admitting: Physician Assistant
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
## 2019-06-17 NOTE — Telephone Encounter (Signed)
Received handwritten message from clerical from patient.  Per message, patient wants someone to call about her medication.  Call to patient at number in chart and message left for patient to call my extension/voice mail.

## 2019-06-19 LAB — GONOCOCCUS CULTURE

## 2019-06-20 NOTE — Progress Notes (Signed)
Chart reviewed by Pharmacist  Suzanne Walker PharmD, Contract Pharmacist at [facility]  

## 2019-06-21 ENCOUNTER — Other Ambulatory Visit: Payer: Self-pay | Admitting: Physician Assistant

## 2019-06-21 ENCOUNTER — Other Ambulatory Visit: Payer: Self-pay

## 2019-06-21 ENCOUNTER — Ambulatory Visit: Payer: Medicaid Other | Admitting: Physician Assistant

## 2019-06-21 DIAGNOSIS — Z299 Encounter for prophylactic measures, unspecified: Secondary | ICD-10-CM

## 2019-06-21 DIAGNOSIS — A5901 Trichomonal vulvovaginitis: Secondary | ICD-10-CM

## 2019-06-21 MED ORDER — METRONIDAZOLE 500 MG PO TABS: 2000.0000 mg | ORAL_TABLET | Freq: Once | ORAL | 0 refills | Status: AC

## 2019-06-21 MED ORDER — METRONIDAZOLE 500 MG PO TABS: 2000.0000 mg | ORAL_TABLET | Freq: Once | ORAL | 0 refills | Status: DC
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
## 2019-06-21 NOTE — Progress Notes (Signed)
Patient seen 06/14/2019, and dx with Trich.  Called on 06/17/2019, and reported that she had lost her medication to treat Trich.  Requested that she be able to RTC to pick up meds so that she can take her treatment for Trich.  Patient had agreed to come by on 06/18/2019, but was not able to make it then and comes in this am for treatment.  Patient given Metronidazole 2 g po to take with food, no EtOH for 24 hr before and until 72 hr after taking medicine.  No sex for 7 days and until after partner completes treatment.  Patient verbalizes understanding of instructions.

## 2019-09-15 ENCOUNTER — Ambulatory Visit: Payer: Medicaid Other

## 2019-09-27 ENCOUNTER — Other Ambulatory Visit: Payer: Self-pay

## 2019-09-27 DIAGNOSIS — Z3009 Encounter for other general counseling and advice on contraception: Secondary | ICD-10-CM

## 2019-10-01 MED ORDER — MULTIVITAMINS PO CAPS: 1.0000 | ORAL_CAPSULE | Freq: Every day | ORAL | 0 refills | Status: DC

## 2019-10-11 ENCOUNTER — Encounter: Payer: Medicaid Other | Admitting: Obstetrics

## 2019-10-20 ENCOUNTER — Ambulatory Visit: Payer: Medicaid Other

## 2019-11-01 ENCOUNTER — Ambulatory Visit: Payer: Medicaid Other | Admitting: Advanced Practice Midwife

## 2019-11-01 ENCOUNTER — Other Ambulatory Visit: Payer: Self-pay

## 2019-11-01 ENCOUNTER — Encounter: Payer: Self-pay | Admitting: Advanced Practice Midwife

## 2019-11-01 DIAGNOSIS — F1010 Alcohol abuse, uncomplicated: Secondary | ICD-10-CM

## 2019-11-01 DIAGNOSIS — T7411XS Adult physical abuse, confirmed, sequela: Secondary | ICD-10-CM

## 2019-11-01 DIAGNOSIS — T7411XA Adult physical abuse, confirmed, initial encounter: Secondary | ICD-10-CM | POA: Insufficient documentation

## 2019-11-01 DIAGNOSIS — Z113 Encounter for screening for infections with a predominantly sexual mode of transmission: Secondary | ICD-10-CM

## 2019-11-01 DIAGNOSIS — Z8659 Personal history of other mental and behavioral disorders: Secondary | ICD-10-CM

## 2019-11-01 DIAGNOSIS — F17200 Nicotine dependence, unspecified, uncomplicated: Secondary | ICD-10-CM | POA: Insufficient documentation

## 2019-11-01 LAB — WET PREP FOR TRICH, YEAST, CLUE
Trichomonas Exam: NEGATIVE
Yeast Exam: NEGATIVE

## 2019-11-01 NOTE — Progress Notes (Signed)
Here today for STD screening and PT. Accepts bloodwork. Connell Bognar, RN  
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
## 2019-11-06 LAB — GONOCOCCUS CULTURE

## 2019-11-10 ENCOUNTER — Telehealth: Payer: Self-pay

## 2019-11-10 ENCOUNTER — Other Ambulatory Visit: Payer: Self-pay | Admitting: Physician Assistant

## 2019-11-10 DIAGNOSIS — Z3009 Encounter for other general counseling and advice on contraception: Secondary | ICD-10-CM

## 2019-11-10 NOTE — Telephone Encounter (Signed)
Call to patient, reviewed positive chlamydia lab from 11/02/2019. Patient scheduled for tx appt 11/11/2019 at 1:35pm. Patient instructed to eat before appt. All questions answered.   Harvie Heck, RN

## 2019-11-10 NOTE — Telephone Encounter (Signed)
Call to patient to discuss positive chlamydia from visit 10/29/2019. No answer. LMOM to return call.  Harvie Heck, RN

## 2019-11-12 ENCOUNTER — Telehealth: Payer: Self-pay

## 2019-11-23 ENCOUNTER — Telehealth: Payer: Self-pay

## 2019-11-23 NOTE — Telephone Encounter (Signed)
Call to patient, phone number on file is no longer in service. Patient missed tx appt, needs tx for chlamydia.   Harvie Heck, RN

## 2019-12-16 ENCOUNTER — Telehealth: Payer: Self-pay | Admitting: Family Medicine

## 2019-12-16 NOTE — Telephone Encounter (Signed)
Phone call to pt. Pt had questions about picking up medication. Pt counseled that she needs to be seen by [facility] with appt for tx, not just pick up. Pt states she already has appt at [facility] on Monday. Pt counseled to eat before she comes.

## 2019-12-16 NOTE — Telephone Encounter (Signed)
WANTS A CALL BACK FROM NURSE 

## 2019-12-28 ENCOUNTER — Telehealth: Payer: Self-pay

## 2019-12-29 ENCOUNTER — Other Ambulatory Visit: Payer: Self-pay

## 2019-12-29 ENCOUNTER — Ambulatory Visit: Payer: Medicaid Other

## 2019-12-29 DIAGNOSIS — A749 Chlamydial infection, unspecified: Secondary | ICD-10-CM

## 2019-12-29 MED ORDER — AZITHROMYCIN 500 MG PO TABS
1000.0000 mg | ORAL_TABLET | Freq: Once | ORAL | Status: AC
  Administered 2019-12-29: 1000 mg via ORAL

## 2019-12-29 NOTE — Progress Notes (Signed)
Consulted by RN re: patient with need to be treated for Chlamydia but last  period was over 30 days ago .  Reviewed RN documentation and agree that it reflects our discussion and my recommendations.  Would enc patient to do a pregnancy test at home since unable to void in clinic today.

## 2019-12-29 NOTE — Progress Notes (Signed)
Here for chlamydia treatment. LMP 11/08/2019. Unprotected sex 2 weeks ago. Per pt, using no type bc method. Consult with Beatris Si, PA who orders preg test and for pt to be treated today with Azithromycin 1 gram by mouth DOT. Pt sent to lab and returned to clinic reporting that she could not void. Urine sample not obtained for preg test. Beatris Si, PA updated and to proceed with Azithromycin today. RN treated pt with Azithromycin and pt instructed to call [facility] for re treatment if vomits within next 2 hrs. Counseled pt to eat when leaves clinic. Pt reports understanding and states she plans to do a home preg test. Jerel Shepherd, RN

## 2020-01-08 NOTE — L&D Delivery Note (Signed)
Vaginal Delivery Note  Spontaneous delivery of live viable female infant from the  ROA position through an intact perineum. Delivery of anterior left shoulder with gentle downward guidance followed by delivery of the right posterior shoulder with gentle upward guidance. Body followed spontaneously. Infant placed on maternal chest. Nursery present and helped with neonatal resuscitation and evaluation. Cord clamped and cut after one minute. Cord blood collected. Placenta delivered spontaneously and intact with a 3 vessel cord.  Superficial periurethral laceration, no suture needed. Uterus firm and below umbilicus at the end of the delivery.  Mom and baby recovering in stable condition. Sponge and needle counts were correct at the end of the delivery.  APGARS: 1 minute:8 5 minutes: 9 Weight: pending  Adelene Idler MD [facility], [facility] 12/16/20 5:42 AM

## 2020-01-19 NOTE — Telephone Encounter (Signed)
Tx'd Richmond Campbell, RN

## 2020-01-19 NOTE — Telephone Encounter (Signed)
Tx'd 12/29/19 Richmond Campbell, RN

## 2020-03-08 ENCOUNTER — Ambulatory Visit: Payer: Medicaid Other

## 2020-04-13 ENCOUNTER — Ambulatory Visit: Payer: Medicaid Other

## 2020-05-01 ENCOUNTER — Ambulatory Visit: Payer: Medicaid Other

## 2020-05-04 ENCOUNTER — Encounter: Payer: Medicaid Other | Admitting: Obstetrics and Gynecology

## 2020-05-11 ENCOUNTER — Other Ambulatory Visit (HOSPITAL_COMMUNITY)
Admission: RE | Admit: 2020-05-11 | Discharge: 2020-05-11 | Disposition: A | Discharge status: Home / Self Care | Payer: Medicaid Other | Source: Ambulatory Visit | Attending: Obstetrics and Gynecology | Admitting: Obstetrics and Gynecology

## 2020-05-11 ENCOUNTER — Ambulatory Visit (INDEPENDENT_AMBULATORY_CARE_PROVIDER_SITE_OTHER): Payer: Medicaid Other | Admitting: Obstetrics and Gynecology

## 2020-05-11 ENCOUNTER — Encounter: Payer: Self-pay | Admitting: Obstetrics and Gynecology

## 2020-05-11 ENCOUNTER — Other Ambulatory Visit: Payer: Self-pay

## 2020-05-11 VITALS — BP 100/60 | Wt 223.0 lb

## 2020-05-11 DIAGNOSIS — Z6832 Body mass index (BMI) 32.0-32.9, adult: Secondary | ICD-10-CM

## 2020-05-11 DIAGNOSIS — Z3A10 10 weeks gestation of pregnancy: Secondary | ICD-10-CM

## 2020-05-11 DIAGNOSIS — Z7185 Encounter for immunization safety counseling: Secondary | ICD-10-CM

## 2020-05-11 DIAGNOSIS — O281 Abnormal biochemical finding on antenatal screening of mother: Secondary | ICD-10-CM

## 2020-05-11 DIAGNOSIS — Z113 Encounter for screening for infections with a predominantly sexual mode of transmission: Secondary | ICD-10-CM | POA: Insufficient documentation

## 2020-05-11 DIAGNOSIS — O219 Vomiting of pregnancy, unspecified: Secondary | ICD-10-CM

## 2020-05-11 DIAGNOSIS — Z3481 Encounter for supervision of other normal pregnancy, first trimester: Secondary | ICD-10-CM

## 2020-05-11 DIAGNOSIS — Z3149 Encounter for other procreative investigation and testing: Secondary | ICD-10-CM

## 2020-05-11 DIAGNOSIS — Z130 Encounter for screening for diseases of the blood and blood-forming organs and certain disorders involving the immune mechanism: Secondary | ICD-10-CM

## 2020-05-11 DIAGNOSIS — Z3490 Encounter for supervision of normal pregnancy, unspecified, unspecified trimester: Secondary | ICD-10-CM | POA: Insufficient documentation

## 2020-05-11 MED ORDER — DOXYLAMINE-PYRIDOXINE 10-10 MG PO TBEC: 2.0000 | DELAYED_RELEASE_TABLET | Freq: Every day | ORAL | 5 refills | Status: DC

## 2020-05-11 MED ORDER — CITRANATAL ASSURE 35-1 & 300 MG PO MISC: 2.0000 | Freq: Every day | ORAL | 3 refills | Status: DC
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
## 2020-05-11 NOTE — Progress Notes (Signed)
NOB- no concerns ?

## 2020-05-12 ENCOUNTER — Emergency Department
Admission: EM | Admit: 2020-05-12 | Discharge: 2020-05-12 | Disposition: A | Discharge status: Home / Self Care | Payer: Medicaid Other | Attending: Emergency Medicine | Admitting: Emergency Medicine

## 2020-05-12 ENCOUNTER — Other Ambulatory Visit: Payer: Self-pay

## 2020-05-12 DIAGNOSIS — F17210 Nicotine dependence, cigarettes, uncomplicated: Secondary | ICD-10-CM | POA: Diagnosis not present

## 2020-05-12 DIAGNOSIS — R0981 Nasal congestion: Secondary | ICD-10-CM | POA: Insufficient documentation

## 2020-05-12 DIAGNOSIS — J3489 Other specified disorders of nose and nasal sinuses: Secondary | ICD-10-CM | POA: Diagnosis not present

## 2020-05-12 DIAGNOSIS — R059 Cough, unspecified: Secondary | ICD-10-CM | POA: Insufficient documentation

## 2020-05-12 LAB — RPR+RH+ABO+RUB AB+AB SCR+CB...
Antibody Screen: NEGATIVE
HIV Screen 4th Generation wRfx: NONREACTIVE
Hematocrit: 32.3 % — ABNORMAL LOW (ref 34.0–46.6)
Hemoglobin: 10.2 g/dL — ABNORMAL LOW (ref 11.1–15.9)
Hepatitis B Surface Ag: NEGATIVE
MCH: 25.2 pg — ABNORMAL LOW (ref 26.6–33.0)
MCHC: 31.6 g/dL (ref 31.5–35.7)
MCV: 80 fL (ref 79–97)
Platelets: 263 10*3/uL (ref 150–450)
RBC: 4.04 x10E6/uL (ref 3.77–5.28)
RDW: 16.2 % — ABNORMAL HIGH (ref 11.7–15.4)
RPR Ser Ql: NONREACTIVE
Rh Factor: POSITIVE
Rubella Antibodies, IGG: 1.81 index (ref >0.99)
Varicella zoster IgG: 366 index (ref >165)
WBC: 8.2 10*3/uL (ref 3.4–10.8)

## 2020-05-12 MED ORDER — BENZONATATE 100 MG PO CAPS: 100.0000 mg | ORAL_CAPSULE | Freq: Three times a day (TID) | ORAL | 0 refills | Status: AC | PRN

## 2020-05-12 MED ORDER — BENZONATATE 100 MG PO CAPS: 100.0000 mg | ORAL_CAPSULE | Freq: Three times a day (TID) | ORAL | 0 refills | Status: DC | PRN

## 2020-05-12 MED ORDER — FAMOTIDINE 40 MG PO TABS: 40.0000 mg | ORAL_TABLET | Freq: Every evening | ORAL | 1 refills | Status: DC
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
## 2020-05-12 NOTE — ED Triage Notes (Addendum)
Patient arrived via POV for a cough x 3 days. Pt states that every time she coughs or sneezes she has pain in her chest, back, and abdomen. No exposure to Covid, not vaccinated at this time. Pt is approximately [redacted] weeks pregnant at this time.

## 2020-05-12 NOTE — Discharge Instructions (Signed)
You can take 200 mg of Tessalon Perles before bed. You can take 40 mg of Pepcid once daily.

## 2020-05-15 ENCOUNTER — Other Ambulatory Visit: Payer: Self-pay | Admitting: Obstetrics and Gynecology

## 2020-05-15 DIAGNOSIS — O23591 Infection of other part of genital tract in pregnancy, first trimester: Secondary | ICD-10-CM

## 2020-05-15 DIAGNOSIS — A5901 Trichomonal vulvovaginitis: Secondary | ICD-10-CM

## 2020-05-15 DIAGNOSIS — O98211 Gonorrhea complicating pregnancy, first trimester: Secondary | ICD-10-CM

## 2020-05-15 LAB — HGB FRACTIONATION CASCADE
Hgb A2: 2.6 % (ref 1.8–3.2)
Hgb A: 97.4 % (ref 96.4–98.8)
Hgb F: 0 % (ref 0.0–2.0)
Hgb S: 0 %

## 2020-05-15 LAB — CERVICOVAGINAL ANCILLARY ONLY
Chlamydia: NEGATIVE
Comment: NEGATIVE
Comment: NEGATIVE
Comment: NORMAL
Neisseria Gonorrhea: POSITIVE — AB
Trichomonas: POSITIVE — AB

## 2020-05-15 MED ORDER — METRONIDAZOLE 500 MG PO TABS: 500.0000 mg | ORAL_TABLET | Freq: Two times a day (BID) | ORAL | 0 refills | Status: AC

## 2020-05-15 MED ORDER — CEFTRIAXONE SODIUM 500 MG IJ SOLR: 500.0000 mg | Freq: Once | INTRAMUSCULAR | Status: DC

## 2020-05-15 NOTE — Progress Notes (Signed)
Spoke with patient via phone. Utilized two patient identifiers. Reviewed NOB labs. Positive for n. gonorrhea and trichomonas. Treatment rx'd. Reviewed safe sex practices and need for partner treatment. Patient stated understanding. Message sent to scheduler to schedule nurse visit for ceftriaxone inj.

## 2020-05-16 ENCOUNTER — Telehealth: Payer: Self-pay | Admitting: Obstetrics and Gynecology

## 2020-05-16 ENCOUNTER — Ambulatory Visit: Payer: Medicaid Other

## 2020-05-16 LAB — URINE CULTURE

## 2020-05-16 NOTE — Telephone Encounter (Signed)
Attempted to reach patient via phone to discuss urine culture from NOB and f/u regarding missed appointment today for rocephin inj. No answer, voicemail box not set up, unable to leave message. Will try again.

## 2020-05-19 ENCOUNTER — Telehealth: Payer: Self-pay | Admitting: Obstetrics and Gynecology

## 2020-05-19 DIAGNOSIS — O2340 Unspecified infection of urinary tract in pregnancy, unspecified trimester: Secondary | ICD-10-CM

## 2020-05-19 MED ORDER — AMOXICILLIN-POT CLAVULANATE 875-125 MG PO TABS: 1.0000 | ORAL_TABLET | Freq: Two times a day (BID) | ORAL | 0 refills | Status: DC

## 2020-05-19 NOTE — Telephone Encounter (Signed)
Spoke with patient via phone. Utilized two patient identifiers. Discussed finding of positive urine culture with patient and need for antibiotic treatment. The phone line was disconnected prior to confirming patient understood plan of care. On attempt to call patient back there was no answer - the voice mailbox was not set up.   There was no answer on second and third attempted callback.  Patient was a no show for nurse visit for ceftriaxone injection. Unable to address via phone due to disconnection and no answer on multiple attempted callbacks.

## 2020-05-22 LAB — INHERITEST CORE(CF97,SMA,FRAX)

## 2020-05-26 ENCOUNTER — Other Ambulatory Visit: Payer: Medicaid Other

## 2020-05-26 ENCOUNTER — Ambulatory Visit (INDEPENDENT_AMBULATORY_CARE_PROVIDER_SITE_OTHER): Payer: Medicaid Other | Admitting: Obstetrics and Gynecology

## 2020-05-26 ENCOUNTER — Other Ambulatory Visit: Payer: Self-pay

## 2020-05-26 VITALS — BP 118/72 | Wt 222.0 lb

## 2020-05-26 DIAGNOSIS — Z3481 Encounter for supervision of other normal pregnancy, first trimester: Secondary | ICD-10-CM

## 2020-05-26 DIAGNOSIS — Z3A12 12 weeks gestation of pregnancy: Secondary | ICD-10-CM

## 2020-05-26 DIAGNOSIS — O98211 Gonorrhea complicating pregnancy, first trimester: Secondary | ICD-10-CM | POA: Diagnosis not present

## 2020-05-26 MED ORDER — CEFTRIAXONE SODIUM 500 MG IJ SOLR
500.0000 mg | Freq: Once | INTRAMUSCULAR | Status: AC
  Administered 2020-05-26: 500 mg via INTRAMUSCULAR

## 2020-05-26 MED ORDER — ONDANSETRON 4 MG PO TBDP
4.0000 mg | ORAL_TABLET | Freq: Four times a day (QID) | ORAL | 0 refills | Status: DC | PRN
Start: 2020-05-26 — End: 2020-10-26
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
## 2020-05-26 NOTE — Progress Notes (Signed)
Dating Scan  Singleton IUP visualized with a CRL of 5.51cm (5.41cm, 5.44cm, 5.67cm) consistent with [redacted]w[redacted]d gestation and EDD of 12/07/2020.  This is consistent with LMP dating EDD of 12/04/2020.  FHT 161 BPM YS: no visualized ROV images normal LOV images normal Anterior fundal fibroid 4.88 x 3.97cm No free fluid  There is a viable singleton gestation.  The fetal biometry correlates with established dating. Detailed evaluation of the fetal anatomy is precluded by early gestational age.  It must be noted that a normal ultrasound particular at this early gestational age is unable to rule out fetal aneuploidy, risk of first trimester miscarriage, or anatomic birth defects.  Christie Austria, MD, [facility], [facility] 05/26/2020, 12:43 PM

## 2020-06-02 ENCOUNTER — Other Ambulatory Visit: Payer: Medicaid Other
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
## 2020-06-23 ENCOUNTER — Encounter: Payer: Medicaid Other | Admitting: Obstetrics and Gynecology

## 2020-07-04 ENCOUNTER — Encounter: Payer: Medicaid Other | Admitting: Advanced Practice Midwife

## 2020-07-11 ENCOUNTER — Telehealth: Payer: Self-pay

## 2020-07-11 NOTE — Telephone Encounter (Signed)
Pt calling; [facility] doesn't have rxs for her; supposed to be three of them.  [phone]  Pt states she called pharm and got it straightened out; has what she needs; has appt scheduled for the 14th as well.

## 2020-07-20 ENCOUNTER — Other Ambulatory Visit: Payer: Medicaid Other

## 2020-07-20 ENCOUNTER — Encounter: Payer: Medicaid Other | Admitting: Advanced Practice Midwife

## 2020-07-20 ENCOUNTER — Other Ambulatory Visit: Payer: Self-pay

## 2020-08-03 ENCOUNTER — Encounter: Payer: Self-pay | Admitting: Obstetrics and Gynecology

## 2020-08-03 ENCOUNTER — Other Ambulatory Visit: Payer: Medicaid Other

## 2020-08-03 ENCOUNTER — Other Ambulatory Visit (HOSPITAL_COMMUNITY)
Admission: RE | Admit: 2020-08-03 | Discharge: 2020-08-03 | Disposition: A | Discharge status: Home / Self Care | Payer: Medicaid Other | Source: Ambulatory Visit | Attending: Obstetrics and Gynecology | Admitting: Obstetrics and Gynecology

## 2020-08-03 ENCOUNTER — Other Ambulatory Visit: Payer: Self-pay

## 2020-08-03 ENCOUNTER — Ambulatory Visit (INDEPENDENT_AMBULATORY_CARE_PROVIDER_SITE_OTHER): Payer: Medicaid Other | Admitting: Obstetrics and Gynecology

## 2020-08-03 VITALS — BP 122/70 | Ht 66.0 in | Wt 213.3 lb

## 2020-08-03 DIAGNOSIS — A5901 Trichomonal vulvovaginitis: Secondary | ICD-10-CM | POA: Diagnosis present

## 2020-08-03 DIAGNOSIS — O98211 Gonorrhea complicating pregnancy, first trimester: Secondary | ICD-10-CM | POA: Diagnosis present

## 2020-08-03 DIAGNOSIS — Z3482 Encounter for supervision of other normal pregnancy, second trimester: Secondary | ICD-10-CM

## 2020-08-03 DIAGNOSIS — O23591 Infection of other part of genital tract in pregnancy, first trimester: Secondary | ICD-10-CM | POA: Insufficient documentation

## 2020-08-03 DIAGNOSIS — Z1379 Encounter for other screening for genetic and chromosomal anomalies: Secondary | ICD-10-CM

## 2020-08-03 DIAGNOSIS — Z3A22 22 weeks gestation of pregnancy: Secondary | ICD-10-CM

## 2020-08-03 NOTE — Patient Instructions (Signed)
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
## 2020-08-07 ENCOUNTER — Other Ambulatory Visit: Payer: Medicaid Other

## 2020-08-07 LAB — MATERNIT21 PLUS CORE+SCA
Fetal Fraction: 14
Monosomy X (Turner Syndrome): NOT DETECTED
Result (T21): NEGATIVE
Trisomy 13 (Patau syndrome): NEGATIVE
Trisomy 18 (Edwards syndrome): NEGATIVE
Trisomy 21 (Down syndrome): NEGATIVE
XXX (Triple X Syndrome): NOT DETECTED
XXY (Klinefelter Syndrome): NOT DETECTED
XYY (Jacobs Syndrome): NOT DETECTED

## 2020-08-07 LAB — CERVICOVAGINAL ANCILLARY ONLY
Bacterial Vaginitis (gardnerella): POSITIVE — AB
Candida Glabrata: NEGATIVE
Candida Vaginitis: NEGATIVE
Chlamydia: POSITIVE — AB
Comment: NEGATIVE
Comment: NEGATIVE
Comment: NEGATIVE
Comment: NEGATIVE
Comment: NEGATIVE
Comment: NORMAL
Neisseria Gonorrhea: NEGATIVE
Trichomonas: POSITIVE — AB

## 2020-08-08 ENCOUNTER — Other Ambulatory Visit: Payer: Self-pay | Admitting: Obstetrics and Gynecology

## 2020-08-08 DIAGNOSIS — O98812 Other maternal infectious and parasitic diseases complicating pregnancy, second trimester: Secondary | ICD-10-CM

## 2020-08-08 DIAGNOSIS — O23591 Infection of other part of genital tract in pregnancy, first trimester: Secondary | ICD-10-CM

## 2020-08-08 DIAGNOSIS — A749 Chlamydial infection, unspecified: Secondary | ICD-10-CM

## 2020-08-08 DIAGNOSIS — A5901 Trichomonal vulvovaginitis: Secondary | ICD-10-CM

## 2020-08-08 MED ORDER — AZITHROMYCIN 500 MG PO TABS: 1000.0000 mg | ORAL_TABLET | Freq: Once | ORAL | 0 refills | Status: AC

## 2020-08-08 MED ORDER — METRONIDAZOLE 500 MG PO TABS: 500.0000 mg | ORAL_TABLET | Freq: Two times a day (BID) | ORAL | 0 refills | Status: DC

## 2020-08-11 ENCOUNTER — Ambulatory Visit: Payer: Medicaid Other | Attending: Obstetrics and Gynecology

## 2020-08-14 ENCOUNTER — Encounter: Payer: Medicaid Other | Admitting: Obstetrics & Gynecology

## 2020-09-06 ENCOUNTER — Ambulatory Visit
Admission: RE | Admit: 2020-09-06 | Discharge: 2020-09-06 | Disposition: A | Discharge status: Home / Self Care | Payer: Medicaid Other | Source: Ambulatory Visit | Attending: Obstetrics and Gynecology | Admitting: Obstetrics and Gynecology

## 2020-09-06 DIAGNOSIS — Z3482 Encounter for supervision of other normal pregnancy, second trimester: Secondary | ICD-10-CM | POA: Insufficient documentation

## 2020-09-07 ENCOUNTER — Other Ambulatory Visit: Payer: Medicaid Other

## 2020-09-07 ENCOUNTER — Encounter: Payer: Medicaid Other | Admitting: Obstetrics

## 2020-09-14 NOTE — Telephone Encounter (Signed)
She no showed for her 9/1 appointment has no follow up appointment scheduled.  This is something we can discuss at her ROB visit that needs to be scheduled

## 2020-09-15 NOTE — Telephone Encounter (Signed)
Contacted patient via phone. Voicemail is not set up.

## 2020-09-26 ENCOUNTER — Telehealth: Payer: Self-pay

## 2020-09-26 NOTE — Telephone Encounter (Signed)
Pt calling for rx for heartburn.  [phone]  Adv pt she can take nexium, protonix, mylanta; avoid fatty, fried, spicey food; eat smaller meals or graze throughout the day; have head of bed elevated 4-6 inches; drink 1/4 cup milk before bedtime; lie on right side b/c stomach empties out on the right.  Pt has only tried tums.  Adv will send msg to provider as well.

## 2020-10-04 ENCOUNTER — Encounter: Payer: Medicaid Other | Admitting: Advanced Practice Midwife

## 2020-10-04 ENCOUNTER — Other Ambulatory Visit: Payer: Medicaid Other

## 2020-10-26 ENCOUNTER — Other Ambulatory Visit (HOSPITAL_COMMUNITY)
Admission: RE | Admit: 2020-10-26 | Discharge: 2020-10-26 | Disposition: A | Discharge status: Home / Self Care | Payer: Medicaid Other | Source: Ambulatory Visit | Attending: Obstetrics | Admitting: Obstetrics

## 2020-10-26 ENCOUNTER — Other Ambulatory Visit: Payer: Self-pay

## 2020-10-26 ENCOUNTER — Ambulatory Visit (INDEPENDENT_AMBULATORY_CARE_PROVIDER_SITE_OTHER): Payer: Medicaid Other | Admitting: Obstetrics

## 2020-10-26 ENCOUNTER — Other Ambulatory Visit: Payer: Medicaid Other

## 2020-10-26 VITALS — BP 110/60 | Wt 217.0 lb

## 2020-10-26 DIAGNOSIS — Z23 Encounter for immunization: Secondary | ICD-10-CM

## 2020-10-26 DIAGNOSIS — Z113 Encounter for screening for infections with a predominantly sexual mode of transmission: Secondary | ICD-10-CM | POA: Insufficient documentation

## 2020-10-26 DIAGNOSIS — Z3A34 34 weeks gestation of pregnancy: Secondary | ICD-10-CM

## 2020-10-26 DIAGNOSIS — O0990 Supervision of high risk pregnancy, unspecified, unspecified trimester: Secondary | ICD-10-CM

## 2020-10-26 DIAGNOSIS — O3660X1 Maternal care for excessive fetal growth, unspecified trimester, fetus 1: Secondary | ICD-10-CM

## 2020-10-26 DIAGNOSIS — Z3482 Encounter for supervision of other normal pregnancy, second trimester: Secondary | ICD-10-CM

## 2020-10-26 NOTE — Addendum Note (Signed)
Addended by: Mirna Mires on: 10/26/2020 06:12 PM   Modules accepted: Orders

## 2020-10-26 NOTE — Progress Notes (Signed)
ROB-noticed pinkish/red on tissue after used bathroom, flu shot and TDAP/BT consent today
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
## 2020-10-30 LAB — CERVICOVAGINAL ANCILLARY ONLY
Bacterial Vaginitis (gardnerella): POSITIVE — AB
Candida Glabrata: NEGATIVE
Candida Vaginitis: NEGATIVE
Chlamydia: NEGATIVE
Comment: NEGATIVE
Comment: NEGATIVE
Comment: NEGATIVE
Comment: NEGATIVE
Comment: NEGATIVE
Comment: NORMAL
Neisseria Gonorrhea: NEGATIVE
Trichomonas: NEGATIVE

## 2020-10-31 ENCOUNTER — Encounter: Payer: Self-pay | Admitting: Obstetrics

## 2020-10-31 ENCOUNTER — Other Ambulatory Visit: Payer: Self-pay | Admitting: Obstetrics

## 2020-10-31 DIAGNOSIS — B9689 Other specified bacterial agents as the cause of diseases classified elsewhere: Secondary | ICD-10-CM

## 2020-10-31 DIAGNOSIS — N760 Acute vaginitis: Secondary | ICD-10-CM

## 2020-10-31 MED ORDER — METRONIDAZOLE 500 MG PO TABS: 500.0000 mg | ORAL_TABLET | Freq: Two times a day (BID) | ORAL | 0 refills | Status: AC

## 2020-11-03 ENCOUNTER — Encounter: Payer: Medicaid Other | Admitting: Advanced Practice Midwife

## 2020-11-03 ENCOUNTER — Other Ambulatory Visit: Payer: Medicaid Other

## 2020-11-13 ENCOUNTER — Encounter: Payer: Medicaid Other | Admitting: Advanced Practice Midwife

## 2020-11-13 ENCOUNTER — Other Ambulatory Visit: Payer: Medicaid Other

## 2020-11-21 ENCOUNTER — Ambulatory Visit: Payer: Medicaid Other | Attending: Obstetrics | Admitting: *Deleted

## 2020-11-21 ENCOUNTER — Other Ambulatory Visit: Payer: Self-pay | Admitting: Obstetrics

## 2020-11-21 ENCOUNTER — Ambulatory Visit (HOSPITAL_BASED_OUTPATIENT_CLINIC_OR_DEPARTMENT_OTHER): Payer: Medicaid Other

## 2020-11-21 ENCOUNTER — Other Ambulatory Visit: Payer: Self-pay

## 2020-11-21 ENCOUNTER — Encounter: Payer: Self-pay | Admitting: *Deleted

## 2020-11-21 ENCOUNTER — Ambulatory Visit: Payer: Medicaid Other

## 2020-11-21 VITALS — BP 117/65 | HR 106

## 2020-11-21 DIAGNOSIS — F17210 Nicotine dependence, cigarettes, uncomplicated: Secondary | ICD-10-CM | POA: Diagnosis not present

## 2020-11-21 DIAGNOSIS — E669 Obesity, unspecified: Secondary | ICD-10-CM | POA: Diagnosis not present

## 2020-11-21 DIAGNOSIS — Z363 Encounter for antenatal screening for malformations: Secondary | ICD-10-CM | POA: Insufficient documentation

## 2020-11-21 DIAGNOSIS — O3660X1 Maternal care for excessive fetal growth, unspecified trimester, fetus 1: Secondary | ICD-10-CM | POA: Diagnosis present

## 2020-11-21 DIAGNOSIS — Z3A38 38 weeks gestation of pregnancy: Secondary | ICD-10-CM | POA: Insufficient documentation

## 2020-11-21 DIAGNOSIS — O99333 Smoking (tobacco) complicating pregnancy, third trimester: Secondary | ICD-10-CM | POA: Insufficient documentation

## 2020-11-21 DIAGNOSIS — O0990 Supervision of high risk pregnancy, unspecified, unspecified trimester: Secondary | ICD-10-CM

## 2020-11-21 DIAGNOSIS — O99213 Obesity complicating pregnancy, third trimester: Secondary | ICD-10-CM | POA: Insufficient documentation

## 2020-11-21 DIAGNOSIS — Z3482 Encounter for supervision of other normal pregnancy, second trimester: Secondary | ICD-10-CM

## 2020-11-22 ENCOUNTER — Telehealth: Payer: Self-pay

## 2020-11-22 NOTE — Telephone Encounter (Signed)
No Answer/VM not set up yet. Trying to reach patient to scheduled ROB and 28 wk labs.

## 2020-11-29 ENCOUNTER — Encounter: Payer: Self-pay | Admitting: Obstetrics

## 2020-12-04 ENCOUNTER — Encounter: Payer: Medicaid Other | Admitting: Obstetrics and Gynecology

## 2020-12-04 ENCOUNTER — Inpatient Hospital Stay: Admit: 2020-12-04 | Payer: Self-pay

## 2020-12-04 NOTE — Telephone Encounter (Signed)
Patient no showed appt. Tried calling no answer 1:50p.m.

## 2020-12-15 ENCOUNTER — Encounter: Payer: Self-pay | Admitting: Obstetrics and Gynecology

## 2020-12-15 ENCOUNTER — Inpatient Hospital Stay
Admission: EM | Admit: 2020-12-15 | Discharge: 2020-12-17 | DRG: 807 | Disposition: A | Discharge status: Home / Self Care | Payer: Medicaid Other | Attending: Obstetrics and Gynecology | Admitting: Obstetrics and Gynecology

## 2020-12-15 DIAGNOSIS — O99214 Obesity complicating childbirth: Secondary | ICD-10-CM | POA: Diagnosis present

## 2020-12-15 DIAGNOSIS — O99213 Obesity complicating pregnancy, third trimester: Secondary | ICD-10-CM

## 2020-12-15 DIAGNOSIS — F17210 Nicotine dependence, cigarettes, uncomplicated: Secondary | ICD-10-CM

## 2020-12-15 DIAGNOSIS — E669 Obesity, unspecified: Secondary | ICD-10-CM

## 2020-12-15 DIAGNOSIS — O26893 Other specified pregnancy related conditions, third trimester: Secondary | ICD-10-CM | POA: Diagnosis present

## 2020-12-15 DIAGNOSIS — F17200 Nicotine dependence, unspecified, uncomplicated: Secondary | ICD-10-CM | POA: Diagnosis present

## 2020-12-15 DIAGNOSIS — Z3A41 41 weeks gestation of pregnancy: Secondary | ICD-10-CM

## 2020-12-15 DIAGNOSIS — O99324 Drug use complicating childbirth: Secondary | ICD-10-CM | POA: Diagnosis not present

## 2020-12-15 DIAGNOSIS — O99334 Smoking (tobacco) complicating childbirth: Secondary | ICD-10-CM | POA: Diagnosis present

## 2020-12-15 DIAGNOSIS — O480 Post-term pregnancy: Secondary | ICD-10-CM | POA: Diagnosis present

## 2020-12-15 DIAGNOSIS — Z20822 Contact with and (suspected) exposure to covid-19: Secondary | ICD-10-CM | POA: Diagnosis present

## 2020-12-15 DIAGNOSIS — O09293 Supervision of pregnancy with other poor reproductive or obstetric history, third trimester: Secondary | ICD-10-CM | POA: Diagnosis not present

## 2020-12-15 DIAGNOSIS — O0933 Supervision of pregnancy with insufficient antenatal care, third trimester: Secondary | ICD-10-CM | POA: Diagnosis not present

## 2020-12-15 DIAGNOSIS — O99323 Drug use complicating pregnancy, third trimester: Secondary | ICD-10-CM

## 2020-12-15 DIAGNOSIS — O479 False labor, unspecified: Secondary | ICD-10-CM | POA: Diagnosis present

## 2020-12-15 LAB — COMPREHENSIVE METABOLIC PANEL
ALT: 9 U/L (ref 0–44)
AST: 15 U/L (ref 15–41)
Albumin: 3 g/dL — ABNORMAL LOW (ref 3.5–5.0)
Alkaline Phosphatase: 194 U/L — ABNORMAL HIGH (ref 38–126)
Anion gap: 8 (ref 5–15)
BUN: 9 mg/dL (ref 6–20)
CO2: 20 mmol/L — ABNORMAL LOW (ref 22–32)
Calcium: 8.7 mg/dL — ABNORMAL LOW (ref 8.9–10.3)
Chloride: 107 mmol/L (ref 98–111)
Creatinine, Ser: 0.41 mg/dL — ABNORMAL LOW (ref 0.44–1.00)
GFR, Estimated: >60 mL/min (ref >60)
Glucose, Bld: 86 mg/dL (ref 70–99)
Potassium: 3.7 mmol/L (ref 3.5–5.1)
Sodium: 135 mmol/L (ref 135–145)
Total Bilirubin: 0.7 mg/dL (ref 0.3–1.2)
Total Protein: 7.3 g/dL (ref 6.5–8.1)

## 2020-12-15 LAB — RESP PANEL BY RT-PCR (FLU A&B, COVID) ARPGX2
Influenza A by PCR: NEGATIVE
Influenza B by PCR: NEGATIVE
SARS Coronavirus 2 by RT PCR: NEGATIVE

## 2020-12-15 LAB — RAPID HIV SCREEN (HIV 1/2 AB+AG)
HIV 1/2 Antibodies: NONREACTIVE
HIV-1 P24 Antigen - HIV24: NONREACTIVE

## 2020-12-15 LAB — PROTEIN / CREATININE RATIO, URINE
Creatinine, Urine: 139 mg/dL
Protein Creatinine Ratio: 0.23 mg/mg{Cre} — ABNORMAL HIGH (ref 0.00–0.15)
Total Protein, Urine: 32 mg/dL

## 2020-12-15 LAB — CBC
HCT: 31.5 % — ABNORMAL LOW (ref 36.0–46.0)
Hemoglobin: 9.8 g/dL — ABNORMAL LOW (ref 12.0–15.0)
MCH: 24.4 pg — ABNORMAL LOW (ref 26.0–34.0)
MCHC: 31.1 g/dL (ref 30.0–36.0)
MCV: 78.6 fL — ABNORMAL LOW (ref 80.0–100.0)
Platelets: 240 10*3/uL (ref 150–400)
RBC: 4.01 MIL/uL (ref 3.87–5.11)
RDW: 16.2 % — ABNORMAL HIGH (ref 11.5–15.5)
WBC: 10.3 10*3/uL (ref 4.0–10.5)
nRBC: 0 % (ref 0.0–0.2)

## 2020-12-15 LAB — URINE DRUG SCREEN, QUALITATIVE (ARMC ONLY)
Amphetamines, Ur Screen: NOT DETECTED
Barbiturates, Ur Screen: NOT DETECTED
Benzodiazepine, Ur Scrn: NOT DETECTED
Cannabinoid 50 Ng, Ur ~~LOC~~: NOT DETECTED
Cocaine Metabolite,Ur ~~LOC~~: POSITIVE — AB
MDMA (Ecstasy)Ur Screen: NOT DETECTED
Methadone Scn, Ur: NOT DETECTED
Opiate, Ur Screen: NOT DETECTED
Phencyclidine (PCP) Ur S: NOT DETECTED
Tricyclic, Ur Screen: NOT DETECTED

## 2020-12-15 LAB — TYPE AND SCREEN
ABO/RH(D): O POS
Antibody Screen: NEGATIVE

## 2020-12-15 MED ORDER — LIDOCAINE HCL (PF) 1 % IJ SOLN
30.0000 mL | INTRAMUSCULAR | Status: DC | PRN
  Filled 2020-12-15: qty 30

## 2020-12-15 MED ORDER — PHENYLEPHRINE 40 MCG/ML (10ML) SYRINGE FOR IV PUSH (FOR BLOOD PRESSURE SUPPORT): 80.0000 ug | PREFILLED_SYRINGE | INTRAVENOUS | Status: DC | PRN

## 2020-12-15 MED ORDER — SOD CITRATE-CITRIC ACID 500-334 MG/5ML PO SOLN: 30.0000 mL | ORAL | Status: DC | PRN

## 2020-12-15 MED ORDER — EPHEDRINE 5 MG/ML INJ: 10.0000 mg | INTRAVENOUS | Status: DC | PRN

## 2020-12-15 MED ORDER — ACETAMINOPHEN 325 MG PO TABS: 650.0000 mg | ORAL_TABLET | ORAL | Status: DC | PRN

## 2020-12-15 MED ORDER — LACTATED RINGERS IV SOLN: 500.0000 mL | INTRAVENOUS | Status: DC | PRN

## 2020-12-15 MED ORDER — OXYTOCIN BOLUS FROM INFUSION
333.0000 mL | Freq: Once | INTRAVENOUS | Status: AC
  Administered 2020-12-16: 333 mL via INTRAVENOUS

## 2020-12-15 MED ORDER — LACTATED RINGERS IV SOLN: INTRAVENOUS | Status: DC

## 2020-12-15 MED ORDER — OXYTOCIN-SODIUM CHLORIDE 30-0.9 UT/500ML-% IV SOLN
2.5000 [IU]/h | INTRAVENOUS | Status: DC
  Administered 2020-12-16: 2.5 [IU]/h via INTRAVENOUS
  Filled 2020-12-15 (×2): qty 500

## 2020-12-15 MED ORDER — LACTATED RINGERS IV SOLN: 500.0000 mL | Freq: Once | INTRAVENOUS | Status: DC

## 2020-12-15 MED ORDER — DIPHENHYDRAMINE HCL 50 MG/ML IJ SOLN: 12.5000 mg | INTRAMUSCULAR | Status: DC | PRN

## 2020-12-15 MED ORDER — BUTORPHANOL TARTRATE 2 MG/ML IJ SOLN
2.0000 mg | INTRAMUSCULAR | Status: DC | PRN
  Administered 2020-12-15: 1 mg via INTRAVENOUS
  Filled 2020-12-15: qty 2

## 2020-12-15 MED ORDER — ONDANSETRON HCL 4 MG/2ML IJ SOLN: 4.0000 mg | Freq: Four times a day (QID) | INTRAMUSCULAR | Status: DC | PRN

## 2020-12-15 MED ORDER — FENTANYL-BUPIVACAINE-NACL 0.5-0.125-0.9 MG/250ML-% EP SOLN
12.0000 mL/h | EPIDURAL | Status: DC | PRN
  Administered 2020-12-16: 12 mL/h via EPIDURAL
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
## 2020-12-16 ENCOUNTER — Inpatient Hospital Stay: Payer: Medicaid Other | Admitting: Anesthesiology

## 2020-12-16 ENCOUNTER — Encounter: Payer: Self-pay | Admitting: Obstetrics and Gynecology

## 2020-12-16 DIAGNOSIS — O99324 Drug use complicating childbirth: Secondary | ICD-10-CM

## 2020-12-16 DIAGNOSIS — O99214 Obesity complicating childbirth: Secondary | ICD-10-CM

## 2020-12-16 LAB — RPR: RPR Ser Ql: NONREACTIVE

## 2020-12-16 LAB — CHLAMYDIA/NGC RT PCR (ARMC ONLY)
Chlamydia Tr: NOT DETECTED
N gonorrhoeae: NOT DETECTED

## 2020-12-16 LAB — GROUP B STREP BY PCR: Group B strep by PCR: NEGATIVE

## 2020-12-16 MED ORDER — DIPHENHYDRAMINE HCL 25 MG PO CAPS: 25.0000 mg | ORAL_CAPSULE | Freq: Four times a day (QID) | ORAL | Status: DC | PRN

## 2020-12-16 MED ORDER — BENZOCAINE-MENTHOL 20-0.5 % EX AERO: 1.0000 "application " | INHALATION_SPRAY | CUTANEOUS | Status: DC | PRN

## 2020-12-16 MED ORDER — AMPICILLIN SODIUM 2 G IJ SOLR: 2.0000 g | Freq: Four times a day (QID) | INTRAMUSCULAR | Status: DC

## 2020-12-16 MED ORDER — ERYTHROMYCIN LACTOBIONATE 500 MG IV SOLR: 250.0000 mg | Freq: Four times a day (QID) | INTRAVENOUS | Status: DC

## 2020-12-16 MED ORDER — IBUPROFEN 600 MG PO TABS
600.0000 mg | ORAL_TABLET | Freq: Four times a day (QID) | ORAL | Status: DC
  Administered 2020-12-16 – 2020-12-17 (×3): 600 mg via ORAL
  Filled 2020-12-16 (×3): qty 1

## 2020-12-16 MED ORDER — ONDANSETRON HCL 4 MG PO TABS: 4.0000 mg | ORAL_TABLET | ORAL | Status: DC | PRN

## 2020-12-16 MED ORDER — PRENATAL MULTIVITAMIN CH
1.0000 | ORAL_TABLET | Freq: Every day | ORAL | Status: DC
  Administered 2020-12-16: 1 via ORAL
  Filled 2020-12-16: qty 1

## 2020-12-16 MED ORDER — COCONUT OIL OIL: 1.0000 "application " | TOPICAL_OIL | Status: DC | PRN

## 2020-12-16 MED ORDER — LIDOCAINE-EPINEPHRINE (PF) 1.5 %-1:200000 IJ SOLN
INTRAMUSCULAR | Status: DC | PRN
Start: 2020-12-16 — End: 2020-12-16
  Administered 2020-12-16: 3 mL via EPIDURAL

## 2020-12-16 MED ORDER — DOCUSATE SODIUM 100 MG PO CAPS
100.0000 mg | ORAL_CAPSULE | Freq: Two times a day (BID) | ORAL | Status: DC
  Administered 2020-12-16 – 2020-12-17 (×2): 100 mg via ORAL
  Filled 2020-12-16 (×2): qty 1

## 2020-12-16 MED ORDER — ZOLPIDEM TARTRATE 5 MG PO TABS: 5.0000 mg | ORAL_TABLET | Freq: Every evening | ORAL | Status: DC | PRN

## 2020-12-16 MED ORDER — FENTANYL-BUPIVACAINE-NACL 0.5-0.125-0.9 MG/250ML-% EP SOLN
EPIDURAL | Status: AC
  Filled 2020-12-16: qty 250

## 2020-12-16 MED ORDER — ACETAMINOPHEN 500 MG PO TABS
1000.0000 mg | ORAL_TABLET | Freq: Four times a day (QID) | ORAL | Status: DC
  Administered 2020-12-16 – 2020-12-17 (×3): 1000 mg via ORAL
  Filled 2020-12-16 (×3): qty 2

## 2020-12-16 MED ORDER — SIMETHICONE 80 MG PO CHEW: 80.0000 mg | CHEWABLE_TABLET | ORAL | Status: DC | PRN

## 2020-12-16 MED ORDER — LIDOCAINE HCL (PF) 1 % IJ SOLN
INTRAMUSCULAR | Status: DC | PRN
  Administered 2020-12-16: 3 mL via SUBCUTANEOUS

## 2020-12-16 MED ORDER — DIBUCAINE (PERIANAL) 1 % EX OINT: 1.0000 "application " | TOPICAL_OINTMENT | CUTANEOUS | Status: DC | PRN

## 2020-12-16 MED ORDER — WITCH HAZEL-GLYCERIN EX PADS: 1.0000 "application " | MEDICATED_PAD | CUTANEOUS | Status: DC | PRN

## 2020-12-16 MED ORDER — ONDANSETRON HCL 4 MG/2ML IJ SOLN: 4.0000 mg | INTRAMUSCULAR | Status: DC | PRN

## 2020-12-16 MED ORDER — SODIUM CHLORIDE 0.9 % IV SOLN
INTRAVENOUS | Status: DC | PRN
  Administered 2020-12-16 (×2): 5 mL via EPIDURAL
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
## 2020-12-16 NOTE — Progress Notes (Signed)
SVE: 6/90/-3 AROM- clear, without complication Continue with expectant management of labor.   Adelene Idler MD, [facility], [facility] 12/16/2020 1:51 AM
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
## 2020-12-16 NOTE — Anesthesia Postprocedure Evaluation (Signed)
Anesthesia Post Note  Patient: Christie Green  Procedure(s) Performed: AN AD HOC LABOR EPIDURAL  Patient location during evaluation: Mother Baby Anesthesia Type: Epidural Level of consciousness: awake and alert Pain management: pain level controlled Vital Signs Assessment: post-procedure vital signs reviewed and stable Respiratory status: spontaneous breathing, nonlabored ventilation and respiratory function stable Cardiovascular status: stable Postop Assessment: no headache, no backache and epidural receding Anesthetic complications: no   No notable events documented.   Last Vitals:  Vitals:   12/16/20 1056 12/16/20 1150  BP: 124/73 120/70  Pulse: 93 88  Resp: 18 20  Temp: 36.7 C 36.7 C  SpO2: 99% 98%    Last Pain:  Vitals:   12/16/20 1150  TempSrc: Oral  PainSc:                  Foye Deer

## 2020-12-16 NOTE — Discharge Instructions (Signed)
Discharge Instructions:   Follow-up Appointment: 1 week, please call the office to schedule  If there are any new medications, they have been ordered and will be available for pickup at the listed pharmacy on your way home from the hospital.   Call the office if you have any of the following: headache, visual changes, fever >101.0 F, chills, shortness of breath, breast concerns, excessive vaginal bleeding, incision drainage or problems, leg pain or redness, depression or any other concerns. If you have vaginal discharge with an odor, let your doctor know.   It is normal to bleed for up to 6 weeks. You should not soak through more than 1 pad in 1 hour. If you have a blood clot larger than your fist with continued bleeding, call your doctor.   Activity: Do not lift > 15 lbs for 6 weeks (do not lift anything heavier than your baby). No intercourse, tampons, swimming pools, hot tubs, baths (only showers) for 6 weeks.  No driving for 1-2 weeks. Do not drive while taking narcotic or opioid pain medication.  Continue taking your prenatal vitamin, especially if breastfeeding. Increase calories and fluids (water) while breastfeeding.   Your milk will come in, in the next couple of days (right now it is colostrum). You may have a slight fever when your milk comes in, but it should go away on its own.  If it does not, and rises above 101 F please call the doctor. You will also feel achy and your breasts will be firm. They will also start to leak. If you are breastfeeding, continue as you have been and you can pump/express milk for comfort.   If you have too much milk, your breasts can become engorged, which could lead to mastitis. This is an infection of the milk ducts. It can be very painful and you will need to notify your doctor to obtain a prescription for antibiotics. You can also treat it with a shower or hot/cold compress.   For concerns about your baby, please call your pediatrician.  For  breastfeeding concerns, the lactation consultant can be reached at [phone].   Postpartum blues (feelings of happy one minute and sad another minute) are normal for the first few weeks but if it gets worse let your doctor know.   Congratulations! We enjoyed caring for you and your new bundle of joy!   

## 2020-12-16 NOTE — Anesthesia Procedure Notes (Signed)
Epidural Patient location during procedure: OB Start time: 12/16/2020 12:10 AM End time: 12/16/2020 12:33 AM  Staffing Anesthesiologist: Corinda Gubler, MD Performed: anesthesiologist   Preanesthetic Checklist Completed: patient identified, IV checked, site marked, risks and benefits discussed, surgical consent, monitors and equipment checked, pre-op evaluation and timeout performed  Epidural Patient position: sitting Prep: ChloraPrep Patient monitoring: heart rate, continuous pulse ox and blood pressure Approach: midline Location: L3-L4 Injection technique: LOR saline  Needle:  Needle type: Tuohy  Needle gauge: 17 G Needle length: 9 cm and 9 Needle insertion depth: 9 cm Catheter type: closed end flexible Catheter size: 19 Gauge Catheter at skin depth: 14 cm Test dose: negative and 1.5% lidocaine with Epi 1:200 K  Assessment Sensory level: T10 Events: blood not aspirated, injection not painful, no injection resistance, no paresthesia and negative IV test  Additional Notes first attempt Pt. Evaluated and documentation done after procedure finished. Patient identified. Risks/Benefits/Options discussed with patient including but not limited to bleeding, infection, nerve damage, paralysis, failed block, incomplete pain control, headache, blood pressure changes, nausea, vomiting, reactions to medication both or allergic, itching and postpartum back pain. Confirmed with bedside nurse the patient's most recent platelet count. Confirmed with patient that they are not currently taking any anticoagulation, have any bleeding history or any family history of bleeding disorders. Patient expressed understanding and wished to proceed. All questions were answered. Sterile technique was used throughout the entire procedure. Please see nursing notes for vital signs. Test dose was given through epidural catheter and negative prior to continuing to dose epidural or start infusion. Warning signs of high  block given to the patient including shortness of breath, tingling/numbness in hands, complete motor block, or any concerning symptoms with instructions to call for help. Patient was given instructions on fall risk and not to get out of bed. All questions and concerns addressed with instructions to call with any issues or inadequate analgesia.     Patient tolerated the insertion well without immediate complications.  Reason for block: procedure for painReason for block:procedure for pain

## 2020-12-16 NOTE — Discharge Summary (Signed)
Postpartum Discharge Summary    Patient Name: Christie Green DOB: [birth year] MRN: [id number]  Date of admission: 12/15/2020 Delivery date:12/16/2020  Delivering provider: Adrian Prows R  Date of discharge: 12/17/2020  Admitting diagnosis: Uterine contractions [O47.9] Intrauterine pregnancy: [redacted]w[redacted]d    Secondary diagnosis:  Principal Problem:   Uterine contractions  Additional problems: limited prenatal care      Discharge diagnosis: Term Pregnancy Delivered                                              Post partum procedures: none Augmentation: AROM Complications: None  Hospital course: Onset of Labor With Vaginal Delivery      25y.o. yo [id number] admitted in Active Labor on 12/15/2020. Patient had an uncomplicated labor course as follows:  Membrane Rupture Time/Date: 1:50 AM ,12/16/2020   Delivery Method:Vaginal, Spontaneous  Episiotomy: None  Lacerations:  Periurethral  Patient had an uncomplicated postpartum course.  She is ambulating, tolerating a regular diet, passing flatus, and urinating well. Patient is discharged home in stable condition on 12/17/20.  Newborn Data: Birth date:12/16/2020  Birth time:5:25 AM  Gender:Female  Living status:Living  Apgars:8 ,9  Weight:3320 g   Magnesium Sulfate received: No BMZ received: No Rhophylac:No MMR:No T-DaP:Given prenatally Flu: No Transfusion:No  Physical exam  Vitals:   12/16/20 1603 12/16/20 2012 12/16/20 2344 12/17/20 0741  BP: 118/74 123/63 132/83 119/62  Pulse: 77 65 90 73  Resp:  '18 20 18  ' Temp: 98.6 F (37 C) 98 F (36.7 C) 98 F (36.7 C) 98.2 F (36.8 C)  TempSrc: Oral Oral Oral Oral  SpO2: 98% 98% 99% 99%   General: alert, cooperative, and no distress Lochia: appropriate Uterine Fundus: firm Incision: No significant erythema DVT Evaluation: No evidence of DVT seen on physical exam. Labs: Lab Results  Component Value Date   WBC 13.2 (H) 12/17/2020   HGB 8.4 (L) 12/17/2020    HCT 27.1 (L) 12/17/2020   MCV 78.6 (L) 12/17/2020   PLT 217 12/17/2020   CMP Latest Ref Rng & Units 12/15/2020  Glucose 70 - 99 mg/dL 86  BUN 6 - 20 mg/dL 9  Creatinine 0.44 - 1.00 mg/dL 0.41(L)  Sodium 135 - 145 mmol/L 135  Potassium 3.5 - 5.1 mmol/L 3.7  Chloride 98 - 111 mmol/L 107  CO2 22 - 32 mmol/L 20(L)  Calcium 8.9 - 10.3 mg/dL 8.7(L)  Total Protein 6.5 - 8.1 g/dL 7.3  Total Bilirubin 0.3 - 1.2 mg/dL 0.7  Alkaline Phos 38 - 126 U/L 194(H)  AST 15 - 41 U/L 15  ALT 0 - 44 U/L 9   Edinburgh Score: Edinburgh Postnatal Depression Scale Screening Tool 12/16/2020  I have been able to laugh and see the funny side of things. 0  I have looked forward with enjoyment to things. 0  I have blamed myself unnecessarily when things went wrong. 2  I have been anxious or worried for no good reason. 0  I have felt scared or panicky for no good reason. 0  Things have been getting on top of me. 1  I have been so unhappy that I have had difficulty sleeping. 0  I have felt sad or miserable. 1  I have been so unhappy that I have been crying. 0  The thought of harming myself has occurred to me. 0  Edinburgh Postnatal Depression Scale Total 4      After visit meds:  Allergies as of 12/17/2020   No Known Allergies      Medication List     TAKE these medications    ibuprofen 600 MG tablet Commonly known as: ADVIL Take 1 tablet (600 mg total) by mouth every 6 (six) hours.         Discharge home in stable condition Infant Feeding: Bottle Infant Disposition:home with mother Discharge instruction: per After Visit Summary and Postpartum booklet. Activity: Advance as tolerated. Pelvic rest for 6 weeks.  Diet: routine diet Anticipated Birth Control: Plans Interval BTL Postpartum Appointment:4 weeks Additional Postpartum F/U:  tubal ligation scheduling Future Appointments:No future appointments. Follow up Visit:  Follow-up Information     Schuman, Stefanie Libel, MD. Schedule an  appointment as soon as possible for a visit in 6 week(s).   Specialty: Obstetrics and Gynecology Contact information: [address] [place] [address] [phone]                     12/17/2020 Malachy Mood, MD

## 2020-12-16 NOTE — Progress Notes (Signed)
Patient requested to go outside to "smoke". Offered a nicotine patch and patient refused. Educated patient that someone needs to stay in the room with baby while she is outside, and per policy there is no smoking allowed on hospital property. Patient alerted RN that she is walking outside and support person will remain in room with baby.

## 2020-12-17 LAB — CBC
HCT: 27.1 % — ABNORMAL LOW (ref 36.0–46.0)
Hemoglobin: 8.4 g/dL — ABNORMAL LOW (ref 12.0–15.0)
MCH: 24.3 pg — ABNORMAL LOW (ref 26.0–34.0)
MCHC: 31 g/dL (ref 30.0–36.0)
MCV: 78.6 fL — ABNORMAL LOW (ref 80.0–100.0)
Platelets: 217 10*3/uL (ref 150–400)
RBC: 3.45 MIL/uL — ABNORMAL LOW (ref 3.87–5.11)
RDW: 16.4 % — ABNORMAL HIGH (ref 11.5–15.5)
WBC: 13.2 10*3/uL — ABNORMAL HIGH (ref 4.0–10.5)
nRBC: 0 % (ref 0.0–0.2)

## 2020-12-17 MED ORDER — IBUPROFEN 600 MG PO TABS: 600.0000 mg | ORAL_TABLET | Freq: Four times a day (QID) | ORAL | 0 refills | Status: DC

## 2020-12-17 NOTE — Progress Notes (Signed)
Patient discharged with infant. Discharge instructions, prescriptions, and follow up appointments given to and reviewed with patient. Patient verbalized understanding. Will be escorted out by axillary.  °

## 2020-12-22 ENCOUNTER — Telehealth: Payer: Self-pay | Admitting: Obstetrics and Gynecology

## 2020-12-22 NOTE — Telephone Encounter (Signed)
Attempted to reach the patient for OR scheduling. No answer, v/m has not been set up.

## 2020-12-22 NOTE — Telephone Encounter (Signed)
-----   Message from [name], MD sent at 12/17/2020  9:26 AM EST ----- Regarding: Surgery Surgery Booking Request Patient Full Name:  Christie Green  MRN: [id number]  DOB: 23-Aug-1995  Surgeon: Sunny Schlein Requested Surgery Date and Time: 6 weeks Primary Diagnosis AND Code: unwanted fertility Secondary Diagnosis and Code:  Surgical Procedure: Laparoscopy with Tubal Partial Salingectomy RNFA Requested?: No L&D Notification: No Admission Status: same day surgery Length of Surgery: 50 min Special Case Needs: No H&P: Yes Phone Interview???:  Yes Interpreter: No Medical Clearance:  No Special Scheduling Instructions: No Any known health/anesthesia issues, diabetes, sleep apnea, latex allergy, defibrillator/pacemaker?: No Acuity: P2   (P1 highest, P2 delay may cause harm, P3 low, elective gyn, P4 lowest) Post op follow up visits: preop can also be 6 week postpartum, 1 week and 6 week postpartum

## 2021-02-05 ENCOUNTER — Ambulatory Visit: Payer: Medicaid Other | Admitting: Obstetrics and Gynecology

## 2021-02-12 ENCOUNTER — Ambulatory Visit: Payer: Medicaid Other | Admitting: Obstetrics and Gynecology

## 2021-02-14 ENCOUNTER — Telehealth: Payer: Self-pay

## 2021-02-14 NOTE — Telephone Encounter (Signed)
Attempted to reach the patient for OR scheduling. No answer, v/m has not been set up.

## 2021-02-14 NOTE — Telephone Encounter (Signed)
Pt rtn'd a missed call from my number not knowing who she was calling back. I explained the reason for my call and asked if she was ready to schedule BTL w Schuman. She said that "her friend" that was with her said she had to wait until she was 26 years before having BTL. She also mentioned she would like to do Depo instead.  I advised that she missed her PP on 2/6 and asked if she would like to reschedule so that she can discuss [facility] w [facility]. I was able to schedule her for 2/9 @ 3:35 for PP and BC.  If she would like to move forward with BTL, she will need to sign BTL consent.

## 2021-02-15 ENCOUNTER — Ambulatory Visit: Payer: Medicaid Other | Admitting: Obstetrics and Gynecology

## 2021-03-01 ENCOUNTER — Telehealth: Payer: Self-pay

## 2021-03-01 NOTE — Telephone Encounter (Signed)
Pt calling to see if she was given a depo 12/16/20 when she delivered.  [phone]  Adv pt she did not get a depo after delivery  b/c at that time she was going to have her tubes tied; also adv she needed to have her 6wk pp appt; pt hung up.

## 2021-03-26 ENCOUNTER — Other Ambulatory Visit (HOSPITAL_COMMUNITY)
Admission: RE | Admit: 2021-03-26 | Discharge: 2021-03-26 | Disposition: A | Discharge status: Home / Self Care | Payer: Medicaid Other | Source: Ambulatory Visit | Attending: Obstetrics and Gynecology | Admitting: Obstetrics and Gynecology

## 2021-03-26 ENCOUNTER — Ambulatory Visit (INDEPENDENT_AMBULATORY_CARE_PROVIDER_SITE_OTHER): Payer: Medicaid Other | Admitting: Obstetrics and Gynecology

## 2021-03-26 ENCOUNTER — Other Ambulatory Visit: Payer: Self-pay

## 2021-03-26 ENCOUNTER — Encounter: Payer: Self-pay | Admitting: Obstetrics and Gynecology

## 2021-03-26 VITALS — BP 120/80 | Ht 66.0 in | Wt 212.0 lb

## 2021-03-26 DIAGNOSIS — Z30013 Encounter for initial prescription of injectable contraceptive: Secondary | ICD-10-CM | POA: Diagnosis not present

## 2021-03-26 DIAGNOSIS — Z124 Encounter for screening for malignant neoplasm of cervix: Secondary | ICD-10-CM | POA: Diagnosis present

## 2021-03-26 DIAGNOSIS — Z113 Encounter for screening for infections with a predominantly sexual mode of transmission: Secondary | ICD-10-CM

## 2021-03-26 DIAGNOSIS — R875 Abnormal microbiological findings in specimens from female genital organs: Secondary | ICD-10-CM

## 2021-03-26 DIAGNOSIS — N771 Vaginitis, vulvitis and vulvovaginitis in diseases classified elsewhere: Secondary | ICD-10-CM | POA: Diagnosis not present

## 2021-03-26 DIAGNOSIS — B3731 Acute candidiasis of vulva and vagina: Secondary | ICD-10-CM

## 2021-03-26 MED ORDER — FLUCONAZOLE 150 MG PO TABS: 150.0000 mg | ORAL_TABLET | ORAL | 0 refills | Status: AC

## 2021-03-26 MED ORDER — MEDROXYPROGESTERONE ACETATE 150 MG/ML IM SUSP: 150.0000 mg | INTRAMUSCULAR | 4 refills | Status: DC
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
## 2021-03-27 ENCOUNTER — Ambulatory Visit: Payer: Medicaid Other

## 2021-03-28 ENCOUNTER — Other Ambulatory Visit: Payer: Self-pay | Admitting: Obstetrics and Gynecology

## 2021-03-28 DIAGNOSIS — B9689 Other specified bacterial agents as the cause of diseases classified elsewhere: Secondary | ICD-10-CM

## 2021-03-28 LAB — NUSWAB BV AND CANDIDA, NAA
Atopobium vaginae: HIGH Score — AB
BVAB 2: HIGH Score — AB
Candida albicans, NAA: NEGATIVE
Candida glabrata, NAA: NEGATIVE
Megasphaera 1: HIGH Score — AB

## 2021-03-28 MED ORDER — METRONIDAZOLE 500 MG PO TABS: 500.0000 mg | ORAL_TABLET | Freq: Two times a day (BID) | ORAL | 0 refills | Status: DC

## 2021-04-03 LAB — CYTOLOGY - PAP
Chlamydia: NEGATIVE
Comment: NEGATIVE
Comment: NEGATIVE
Comment: NEGATIVE
Comment: NEGATIVE
Comment: NEGATIVE
Comment: NORMAL
Diagnosis: HIGH — AB
HPV 16: POSITIVE — AB
HPV 18 / 45: NEGATIVE
High risk HPV: POSITIVE — AB
Neisseria Gonorrhea: NEGATIVE
Trichomonas: NEGATIVE

## 2021-04-05 ENCOUNTER — Ambulatory Visit (INDEPENDENT_AMBULATORY_CARE_PROVIDER_SITE_OTHER): Payer: Medicaid Other

## 2021-04-05 DIAGNOSIS — Z3042 Encounter for surveillance of injectable contraceptive: Secondary | ICD-10-CM

## 2021-04-05 MED ORDER — MEDROXYPROGESTERONE ACETATE 150 MG/ML IM SUSP
150.0000 mg | Freq: Once | INTRAMUSCULAR | Status: AC
  Administered 2021-04-05: 150 mg via INTRAMUSCULAR

## 2021-04-05 NOTE — Progress Notes (Signed)
Pt here for depo which was given IM right deltoid.  Pt's LMP was 04/01/21; pt tolerated inj well.  NDC# 4332-9518-84 ?

## 2021-04-09 ENCOUNTER — Telehealth: Payer: Self-pay

## 2021-04-09 NOTE — Telephone Encounter (Signed)
-----   Message from Natale Milch, MD sent at 04/06/2021  8:35 AM EDT ----- Please schedule for a colposcopy

## 2021-04-09 NOTE — Telephone Encounter (Signed)
Contacted patient via phone. No answer. Voicemail is not set up.

## 2021-04-10 NOTE — Telephone Encounter (Signed)
I contacted patient yesterday afternoon and spoke with her about scheduling. We got disconnected and patient has called back to be scheduled

## 2021-04-11 NOTE — Telephone Encounter (Signed)
Contacted patient via phone. Voicemail is not set up. Unable to leave message

## 2021-04-23 NOTE — Telephone Encounter (Signed)
Contacted patient via phone. No answer. Voicemail not set up unable to leave message! Multiple attempt to reach patient have been unsuccessful in scheduling patient.

## 2021-06-21 ENCOUNTER — Ambulatory Visit: Payer: Medicaid Other

## 2021-06-22 ENCOUNTER — Ambulatory Visit: Payer: Medicaid Other

## 2021-06-22 ENCOUNTER — Telehealth: Payer: Self-pay | Admitting: Licensed Practical Nurse

## 2021-06-22 NOTE — Telephone Encounter (Signed)
Called pt to reschedule appt that was scheduled for 6/16 at 2:00.  Caller had restrictions on calls so it did not allow a message to be left.

## 2021-06-25 ENCOUNTER — Ambulatory Visit (INDEPENDENT_AMBULATORY_CARE_PROVIDER_SITE_OTHER): Payer: Medicaid Other

## 2021-06-25 VITALS — BP 126/50 | Wt 225.0 lb

## 2021-06-25 DIAGNOSIS — Z3042 Encounter for surveillance of injectable contraceptive: Secondary | ICD-10-CM

## 2021-06-25 MED ORDER — MEDROXYPROGESTERONE ACETATE 150 MG/ML IM SUSP
150.0000 mg | Freq: Once | INTRAMUSCULAR | Status: AC
  Administered 2021-06-25: 150 mg via INTRAMUSCULAR

## 2021-06-25 NOTE — Progress Notes (Signed)
Pt here for depo which was given IM right deltoid.  Pt tolerated well.  NDC# 0548-5701-00 °

## 2021-06-25 NOTE — Telephone Encounter (Signed)
Pt called in and rescheduled the appt for 6/19 at 2:00.

## 2021-07-16 ENCOUNTER — Ambulatory Visit (INDEPENDENT_AMBULATORY_CARE_PROVIDER_SITE_OTHER): Payer: Medicaid Other | Admitting: Obstetrics & Gynecology

## 2021-07-16 ENCOUNTER — Encounter: Payer: Self-pay | Admitting: Obstetrics & Gynecology

## 2021-07-16 ENCOUNTER — Other Ambulatory Visit (HOSPITAL_COMMUNITY)
Admission: RE | Admit: 2021-07-16 | Discharge: 2021-07-16 | Disposition: A | Discharge status: Home / Self Care | Payer: Medicaid Other | Source: Ambulatory Visit | Attending: Obstetrics & Gynecology | Admitting: Obstetrics & Gynecology

## 2021-07-16 VITALS — BP 110/60 | Ht 66.0 in | Wt 226.0 lb

## 2021-07-16 DIAGNOSIS — R87810 Cervical high risk human papillomavirus (HPV) DNA test positive: Secondary | ICD-10-CM

## 2021-07-16 DIAGNOSIS — N926 Irregular menstruation, unspecified: Secondary | ICD-10-CM

## 2021-07-16 DIAGNOSIS — R87610 Atypical squamous cells of undetermined significance on cytologic smear of cervix (ASC-US): Secondary | ICD-10-CM

## 2021-07-16 NOTE — Progress Notes (Signed)
   Subjective:    Patient ID: Christie Green, female    DOB: [birth year], 26 y.o.   MRN: [id number]  HPI  26 yo single P3 here with the issue of irregular bleeding since her most recent depo provera shot last month. It is sometimes heavy and sometimes spotting. This is her second depo shot since her vaginal delivery 12/2020. She is not breast feeding. She also reports that she had a pustule come up on her left nipple and now it is resolving. She denies any family history of breast cancer.  Review of Systems She is sexually active with one partner at this time.     Objective:   Physical Exam Well nourished, well hydrated African American female, no apparent distress She is ambulating and conversing normally. Breast tissue normal bilaterally, no evidence of cancer The left nipple has a 3 mm scab, clearly healing  Genitourinary:             External: Normal external female genitalia.  Normal urethral meatus, normal Bartholin's and Skene's glands.  Tampon removed, lightly stained with blood             Vagina: Normal vaginal mucosa, no evidence of prolapse.               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

## 2021-07-17 ENCOUNTER — Telehealth: Payer: Self-pay | Admitting: Obstetrics & Gynecology

## 2021-07-17 NOTE — Telephone Encounter (Signed)
Pt needs to schedule colposcopy with Dr. Marice Potter or Dr. Alvester Morin.  Left message for pt to call back to schedule.  Possible appt with Dr. Marice Potter on 8/8.

## 2021-07-19 LAB — CERVICOVAGINAL ANCILLARY ONLY
Bacterial Vaginitis (gardnerella): POSITIVE — AB
Candida Glabrata: NEGATIVE
Candida Vaginitis: NEGATIVE
Chlamydia: NEGATIVE
Comment: NEGATIVE
Comment: NEGATIVE
Comment: NEGATIVE
Comment: NEGATIVE
Comment: NEGATIVE
Comment: NORMAL
Neisseria Gonorrhea: NEGATIVE
Trichomonas: NEGATIVE

## 2021-07-21 ENCOUNTER — Encounter: Payer: Self-pay | Admitting: Obstetrics & Gynecology

## 2021-07-21 ENCOUNTER — Other Ambulatory Visit: Payer: Self-pay | Admitting: Obstetrics & Gynecology

## 2021-07-21 MED ORDER — METRONIDAZOLE 500 MG PO TABS: 500.0000 mg | ORAL_TABLET | Freq: Two times a day (BID) | ORAL | 0 refills | Status: DC

## 2021-07-21 NOTE — Progress Notes (Unsigned)
Flagyl prescribed for BV on Aptima testing.

## 2021-07-27 NOTE — Telephone Encounter (Signed)
Letter received from [facility] following up on patient's abnormal pap smear. Chart reviewed. Patient was notified via my chart of her results and need for colposcopy. Message from provider and scheduling has been viewed by patient. No appointment has been made.

## 2021-07-27 NOTE — Telephone Encounter (Signed)
Called and left voicemail for patient to call back to be scheduled. 

## 2021-07-30 ENCOUNTER — Encounter: Payer: Self-pay | Admitting: Obstetrics

## 2021-07-30 NOTE — Telephone Encounter (Signed)
I have mailed letter to reach patient about scheduling.

## 2021-09-18 ENCOUNTER — Ambulatory Visit: Payer: Medicaid Other

## 2021-09-18 NOTE — Progress Notes (Deleted)
Date last pap: 03/26/21. Last Depo-Provera: 06/25/21. Side Effects if any: None. Serum HCG indicated? N/A. Depo-Provera 150 mg IM given by: Rocco Serene, LPN. Site: Left Deltoid. Next appointment due 12/04/21 - 12/18/2021.

## 2022-01-28 ENCOUNTER — Ambulatory Visit: Payer: Self-pay | Admitting: Advanced Practice Midwife

## 2022-02-06 ENCOUNTER — Ambulatory Visit (INDEPENDENT_AMBULATORY_CARE_PROVIDER_SITE_OTHER): Payer: Self-pay | Admitting: Obstetrics

## 2022-02-06 ENCOUNTER — Telehealth: Payer: Self-pay | Admitting: Obstetrics & Gynecology

## 2022-02-06 ENCOUNTER — Encounter: Payer: Self-pay | Admitting: Obstetrics

## 2022-02-06 ENCOUNTER — Other Ambulatory Visit (HOSPITAL_COMMUNITY)
Admission: RE | Admit: 2022-02-06 | Discharge: 2022-02-06 | Disposition: A | Discharge status: Home / Self Care | Payer: Self-pay | Source: Ambulatory Visit | Attending: Obstetrics | Admitting: Obstetrics

## 2022-02-06 VITALS — BP 109/65 | HR 77 | Resp 16 | Ht 67.0 in | Wt 248.3 lb

## 2022-02-06 DIAGNOSIS — R87810 Cervical high risk human papillomavirus (HPV) DNA test positive: Secondary | ICD-10-CM | POA: Insufficient documentation

## 2022-02-06 DIAGNOSIS — Z01419 Encounter for gynecological examination (general) (routine) without abnormal findings: Secondary | ICD-10-CM

## 2022-02-06 DIAGNOSIS — Z124 Encounter for screening for malignant neoplasm of cervix: Secondary | ICD-10-CM | POA: Insufficient documentation

## 2022-02-06 DIAGNOSIS — Z113 Encounter for screening for infections with a predominantly sexual mode of transmission: Secondary | ICD-10-CM

## 2022-02-06 DIAGNOSIS — R87610 Atypical squamous cells of undetermined significance on cytologic smear of cervix (ASC-US): Secondary | ICD-10-CM | POA: Insufficient documentation

## 2022-02-06 DIAGNOSIS — Z3042 Encounter for surveillance of injectable contraceptive: Secondary | ICD-10-CM

## 2022-02-06 MED ORDER — MEDROXYPROGESTERONE ACETATE 150 MG/ML IM SUSP
150.0000 mg | Freq: Once | INTRAMUSCULAR | Status: AC
  Administered 2022-02-06: 150 mg via INTRAMUSCULAR

## 2022-02-06 NOTE — Telephone Encounter (Signed)
Called patient to get her scheduled for Colpo with Dr.Dove.Wasn't able to leave a voicemail because she don't have it setup.

## 2022-02-06 NOTE — Addendum Note (Signed)
Addended by: Minette Headland on: 02/06/2022 03:29 PM   Modules accepted: Orders
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
## 2022-02-06 NOTE — Telephone Encounter (Signed)
Pt called triage regarding missed call. I had on her hold so I can talk to someone upfront and call got disconnected. Called her back, no answer, voice mail not set up. Pls call pt to schedule colpo.

## 2022-02-07 LAB — CERVICOVAGINAL ANCILLARY ONLY
Bacterial Vaginitis (gardnerella): POSITIVE — AB
Chlamydia: NEGATIVE
Comment: NEGATIVE
Comment: NEGATIVE
Comment: NEGATIVE
Comment: NORMAL
Neisseria Gonorrhea: POSITIVE — AB
Trichomonas: POSITIVE — AB

## 2022-02-10 ENCOUNTER — Encounter: Payer: Self-pay | Admitting: Obstetrics

## 2022-02-10 ENCOUNTER — Other Ambulatory Visit: Payer: Self-pay | Admitting: Obstetrics

## 2022-02-10 DIAGNOSIS — B9689 Other specified bacterial agents as the cause of diseases classified elsewhere: Secondary | ICD-10-CM

## 2022-02-10 DIAGNOSIS — A599 Trichomoniasis, unspecified: Secondary | ICD-10-CM

## 2022-02-10 DIAGNOSIS — A549 Gonococcal infection, unspecified: Secondary | ICD-10-CM

## 2022-02-10 MED ORDER — METRONIDAZOLE 500 MG PO TABS: 500.0000 mg | ORAL_TABLET | Freq: Two times a day (BID) | ORAL | 0 refills | Status: AC

## 2022-02-10 MED ORDER — CEFTRIAXONE SODIUM 500 MG IJ SOLR
500.0000 mg | Freq: Once | INTRAMUSCULAR | Status: AC
  Administered 2022-02-15: 500 mg via INTRAMUSCULAR

## 2022-02-13 LAB — CYTOLOGY - PAP
Diagnosis: NEGATIVE
Diagnosis: REACTIVE

## 2022-02-14 NOTE — Progress Notes (Signed)
    NURSE VISIT NOTE  Subjective:    Patient ID: Christie Green, female    DOB: May 07, 1995, 27 y.o.   MRN: [id number]  HPI  Patient is a 27 y.o. G4P3003 female who presents for Rocephin injection. Order to administer given by Gigi Gin, CNM.  Objective:    BP 122/78   Ht 5\' 7"  (1.702 m)   Wt 248 lb (112.5 kg)   BMI 38.84 kg/m   Rocephin 500mg mg given IM by: Quintella Baton, CMA. Site: Right Ventrogluteal.   Lab Review  @THIS  VISIT ONLY@  Assessment:   No diagnosis found.   Plan:   Return  to clinic 4 weeks for TOC.   Quintella Baton, CMA

## 2022-02-15 ENCOUNTER — Ambulatory Visit (INDEPENDENT_AMBULATORY_CARE_PROVIDER_SITE_OTHER): Payer: Self-pay

## 2022-02-15 VITALS — BP 122/78 | Ht 67.0 in | Wt 248.0 lb

## 2022-02-15 DIAGNOSIS — A549 Gonococcal infection, unspecified: Secondary | ICD-10-CM

## 2022-02-15 NOTE — Patient Instructions (Signed)
Ceftriaxone Injection What is this medication? CEFTRIAXONE (sef try AX one) treats infections caused by bacteria. It belongs to a group of medications called cephalosporin antibiotics. It will not treat colds, the flu, or infections caused by viruses. This medicine may be used for other purposes; ask your health care provider or pharmacist if you have questions. COMMON BRAND NAME(S): Ceftrisol Plus, Rocephin What should I tell my care team before I take this medication? They need to know if you have any of these conditions: Bleeding disorder High bilirubin level in newborn patients Kidney disease Liver disease Poor nutrition An unusual or allergic reaction to ceftriaxone, other penicillin or cephalosporin antibiotics, other medications, foods, dyes, or preservatives Pregnant or trying to get pregnant Breast-feeding How should I use this medication? This medication is injected into a vein or a muscle. It is usually given by your care team in a hospital or clinic setting. It may also be given at home. If you get this medication at home, you will be taught how to prepare and give it. Use exactly as directed. Take it as directed on the prescription label at the same time every day. Keep taking it even if you think you are better. It is important that you put your used needles and syringes in a special sharps container. Do not put them in a trash can. If you do not have a sharps container, call your pharmacist or care team to get one. Talk to your care team about the use of this medication in children. While it may be prescribed for children as young as newborns for selected conditions, precautions do apply. Overdosage: If you think you have taken too much of this medicine contact a poison control center or emergency room at once. NOTE: This medicine is only for you. Do not share this medicine with others. What if I miss a dose? If you get this medication at the hospital or clinic: It is important  not to miss your dose. Call your care team if you are unable to keep an appointment. If you give yourself this medication at home: If you miss a dose, take it as soon as you can. Then continue your normal schedule. If it is almost time for your next dose, take only that dose. Do not take double or extra doses. Call your care team with questions. What may interact with this medication? Estrogen or progestin hormones Intravenous calcium This list may not describe all possible interactions. Give your health care provider a list of all the medicines, herbs, non-prescription drugs, or dietary supplements you use. Also tell them if you smoke, drink alcohol, or use illegal drugs. Some items may interact with your medicine. What should I watch for while using this medication? Tell your care team if your symptoms do not start to get better or if they get worse. Do not treat diarrhea with over the counter products. Contact your care team if you have diarrhea that lasts more than 2 days or if it is severe and watery. If you have diabetes, you may get a false-positive result for sugar in your urine. Check with your care team. If you are being treated for a sexually transmitted infection (STI), avoid sexual contact until you have finished your treatment. Your partner may also need treatment. What side effects may I notice from receiving this medication? Side effects that you should report to your care team as soon as possible: Allergic reactions--skin rash, itching, hives, swelling of the face, lips, tongue, or throat   Hemolytic anemia--unusual weakness or fatigue, dizziness, headache, trouble breathing, dark urine, yellowing skin or eyes Severe diarrhea, fever Unusual vaginal discharge, itching, or odor Side effects that usually do not require medical attention (report to your care team if they continue or are bothersome): Diarrhea Headache Nausea Pain, redness, or irritation at injection site This list may  not describe all possible side effects. Call your doctor for medical advice about side effects. You may report side effects to FDA at 1-800-FDA-1088. Where should I keep my medication? Keep out of the reach of children and pets. You will be instructed on how to store this medication. Get rid of any unused medication after the expiration date. To get rid of medications that are no longer needed or have expired: Take the medication to a medication take-back program. Check with your pharmacy or law enforcement to find a location. If you cannot return the medication, ask your pharmacist or care team how to get rid of this medication safely. NOTE: This sheet is a summary. It may not cover all possible information. If you have questions about this medicine, talk to your doctor, pharmacist, or health care provider.  2023 Elsevier/Gold Standard (2007-02-14 00:00:00)

## 2022-02-15 NOTE — Progress Notes (Signed)
[facility] notified.

## 2022-02-18 ENCOUNTER — Ambulatory Visit: Payer: Self-pay | Admitting: Obstetrics & Gynecology

## 2022-02-18 ENCOUNTER — Telehealth: Payer: Self-pay | Admitting: Hematology and Oncology

## 2022-02-18 ENCOUNTER — Ambulatory Visit: Payer: Self-pay

## 2022-02-18 NOTE — Telephone Encounter (Signed)
Dr Hulan Fray, this patient noshow ed her appointment for Colposcopy. Please advise if this patient really need Colposcopy or repeat pap?

## 2022-03-20 ENCOUNTER — Ambulatory Visit: Payer: Self-pay | Admitting: Obstetrics & Gynecology

## 2022-06-20 ENCOUNTER — Telehealth: Payer: Self-pay

## 2022-06-20 NOTE — Telephone Encounter (Signed)
Pt called after hour nurse 06/20/22 at 8:21am; having vaginal bleeding; noticed with wiping after urinating like brown spotting.  She used bathroom again this am and didn't see anything.  Then at 6 or 7am she felt something wet and she had vaginal bleeding which was leaking down her legs, and had a large blood with light bright red bleeding.  Just had a period 2wks ago; is not sure if she is preg.  After hour nurse adv pt to go to ER; after hour nurse tried to reach pt but was unable to.  [phone]  [facility] or send mychart msg to let us know how she is today.

## 2022-06-21 NOTE — Telephone Encounter (Signed)
Left voicemail to return call. My chart message sent to f/u on patient.

## 2022-09-02 ENCOUNTER — Encounter: Payer: Self-pay | Admitting: Emergency Medicine

## 2022-09-02 ENCOUNTER — Other Ambulatory Visit: Payer: Self-pay

## 2022-09-02 DIAGNOSIS — R102 Pelvic and perineal pain: Secondary | ICD-10-CM | POA: Diagnosis not present

## 2022-09-02 DIAGNOSIS — O26891 Other specified pregnancy related conditions, first trimester: Secondary | ICD-10-CM | POA: Diagnosis present

## 2022-09-02 DIAGNOSIS — O219 Vomiting of pregnancy, unspecified: Secondary | ICD-10-CM | POA: Insufficient documentation

## 2022-09-02 LAB — POC URINE PREG, ED: Preg Test, Ur: POSITIVE — AB

## 2022-09-02 LAB — CBC WITH DIFFERENTIAL/PLATELET
Abs Immature Granulocytes: 0.02 10*3/uL (ref 0.00–0.07)
Basophils Absolute: 0 10*3/uL (ref 0.0–0.1)
Basophils Relative: 0 %
Eosinophils Absolute: 0.2 10*3/uL (ref 0.0–0.5)
Eosinophils Relative: 2 %
HCT: 33.2 % — ABNORMAL LOW (ref 36.0–46.0)
Hemoglobin: 10.5 g/dL — ABNORMAL LOW (ref 12.0–15.0)
Immature Granulocytes: 0 %
Lymphocytes Relative: 40 %
Lymphs Abs: 3.9 10*3/uL (ref 0.7–4.0)
MCH: 28.2 pg (ref 26.0–34.0)
MCHC: 31.6 g/dL (ref 30.0–36.0)
MCV: 89 fL (ref 80.0–100.0)
Monocytes Absolute: 0.5 10*3/uL (ref 0.1–1.0)
Monocytes Relative: 6 %
Neutro Abs: 5 10*3/uL (ref 1.7–7.7)
Neutrophils Relative %: 52 %
Platelets: 258 10*3/uL (ref 150–400)
RBC: 3.73 MIL/uL — ABNORMAL LOW (ref 3.87–5.11)
RDW: 14.8 % (ref 11.5–15.5)
WBC: 9.7 10*3/uL (ref 4.0–10.5)
nRBC: 0 % (ref 0.0–0.2)

## 2022-09-02 LAB — COMPREHENSIVE METABOLIC PANEL
ALT: 22 U/L (ref 0–44)
AST: 20 U/L (ref 15–41)
Albumin: 3.7 g/dL (ref 3.5–5.0)
Alkaline Phosphatase: 112 U/L (ref 38–126)
Anion gap: 9 (ref 5–15)
BUN: 11 mg/dL (ref 6–20)
CO2: 23 mmol/L (ref 22–32)
Calcium: 8.9 mg/dL (ref 8.9–10.3)
Chloride: 104 mmol/L (ref 98–111)
Creatinine, Ser: 0.76 mg/dL (ref 0.44–1.00)
GFR, Estimated: >60 mL/min (ref >60)
Glucose, Bld: 119 mg/dL — ABNORMAL HIGH (ref 70–99)
Potassium: 4.1 mmol/L (ref 3.5–5.1)
Sodium: 136 mmol/L (ref 135–145)
Total Bilirubin: 0.4 mg/dL (ref 0.3–1.2)
Total Protein: 7.9 g/dL (ref 6.5–8.1)

## 2022-09-02 LAB — HCG, QUANTITATIVE, PREGNANCY: hCG, Beta Chain, Quant, S: 801 m[IU]/mL — ABNORMAL HIGH (ref <5)

## 2022-09-02 LAB — LIPASE, BLOOD: Lipase: 41 U/L (ref 11–51)

## 2022-09-02 NOTE — ED Triage Notes (Signed)
Pt presents ambulatory to triage via POV with complaints of L sided flank pain x 1 day with associated N/V. States the pain radiates towards her stomach. No meds taken PTA. A&Ox4 at this time. Denies CP or SOB.

## 2022-09-03 ENCOUNTER — Encounter: Payer: Self-pay | Admitting: Radiology

## 2022-09-03 ENCOUNTER — Emergency Department: Payer: Medicaid Other

## 2022-09-03 ENCOUNTER — Emergency Department
Admission: EM | Admit: 2022-09-03 | Discharge: 2022-09-03 | Disposition: A | Discharge status: Home / Self Care | Payer: Medicaid Other | Attending: Emergency Medicine | Admitting: Emergency Medicine

## 2022-09-03 DIAGNOSIS — R112 Nausea with vomiting, unspecified: Secondary | ICD-10-CM

## 2022-09-03 DIAGNOSIS — R102 Pelvic and perineal pain: Secondary | ICD-10-CM

## 2022-09-03 LAB — URINALYSIS, ROUTINE W REFLEX MICROSCOPIC
Bilirubin Urine: NEGATIVE
Glucose, UA: NEGATIVE mg/dL
Hgb urine dipstick: NEGATIVE
Ketones, ur: NEGATIVE mg/dL
Nitrite: POSITIVE — AB
Protein, ur: 30 mg/dL — AB
Specific Gravity, Urine: 1.029 (ref 1.005–1.030)
pH: 7 (ref 5.0–8.0)

## 2022-09-03 MED ORDER — CEPHALEXIN 500 MG PO CAPS
500.0000 mg | ORAL_CAPSULE | Freq: Once | ORAL | Status: AC
  Administered 2022-09-03: 500 mg via ORAL
  Filled 2022-09-03: qty 1

## 2022-09-03 MED ORDER — CEPHALEXIN 500 MG PO CAPS: 500.0000 mg | ORAL_CAPSULE | Freq: Three times a day (TID) | ORAL | 0 refills | Status: AC

## 2022-09-03 MED ORDER — ACETAMINOPHEN 500 MG PO TABS
1000.0000 mg | ORAL_TABLET | Freq: Once | ORAL | Status: AC
  Administered 2022-09-03: 1000 mg via ORAL
  Filled 2022-09-03: qty 2

## 2022-09-03 NOTE — ED Provider Notes (Signed)
[facility] Provider Note    Event Date/Time   First MD Initiated Contact with Patient 09/03/22 0038     (approximate)   History   Flank Pain   HPI  Christie Green is a 27 y.o. female who presents to the ED for evaluation of Flank Pain   Patient presents with midline and left-sided pelvic discomfort for the past day.  She reports very low pain and nothing up on her flanks, despite being triaged as flank pain.  She reports cramping discomfort.  Reports that she has been nauseous and occasionally throwing up in the morning for the past few days.  Denies dysuria or urinary changes.  Uncertain when her last LMP was.  No vaginal bleeding or discharge that is new   Physical Exam   Triage Vital Signs: ED Triage Vitals  Encounter Vitals Group     BP 09/02/22 2253 (!) 145/65     Systolic BP Percentile --      Diastolic BP Percentile --      Pulse Rate 09/02/22 2253 96     Resp 09/02/22 2253 18     Temp 09/02/22 2253 99 F (37.2 C)     Temp Source 09/02/22 2253 Oral     SpO2 09/02/22 2253 100 %     Weight 09/02/22 2251 216 lb (98 kg)     Height 09/02/22 2251 5\' 7"  (1.702 m)     Head Circumference --      Peak Flow --      Pain Score 09/02/22 2251 8     Pain Loc --      Pain Education --      Exclude from Growth Chart --     Most recent vital signs: Vitals:   09/03/22 0430 09/03/22 0458  BP:  (!) 121/55  Pulse: 62   Resp:    Temp:    SpO2: 99%     General: Awake, no distress.  CV:  Good peripheral perfusion.  Resp:  Normal effort.  Abd:  No distention.  Minimal suprapubic tenderness without peritoneal features.  Upper abdomen is benign.  No CVA tenderness bilaterally MSK:  No deformity noted.  Neuro:  No focal deficits appreciated. Other:     ED Results / Procedures / Treatments   Labs (all labs ordered are listed, but only abnormal results are displayed) Labs Reviewed  COMPREHENSIVE METABOLIC PANEL - Abnormal; Notable for the  following components:      Result Value   Glucose, Bld 119 (*)    All other components within normal limits  CBC WITH DIFFERENTIAL/PLATELET - Abnormal; Notable for the following components:   RBC 3.73 (*)    Hemoglobin 10.5 (*)    HCT 33.2 (*)    All other components within normal limits  URINALYSIS, ROUTINE W REFLEX MICROSCOPIC - Abnormal; Notable for the following components:   Color, Urine YELLOW (*)    APPearance HAZY (*)    Protein, ur 30 (*)    Nitrite POSITIVE (*)    Leukocytes,Ua SMALL (*)    Bacteria, UA FEW (*)    All other components within normal limits  HCG, QUANTITATIVE, PREGNANCY - Abnormal; Notable for the following components:   hCG, Beta Chain, Quant, S 801 (*)    All other components within normal limits  POC URINE PREG, ED - Abnormal; Notable for the following components:   Preg Test, Ur Positive (*)    All other components within normal limits  URINE CULTURE  LIPASE, BLOOD    EKG   RADIOLOGY Obstetric ultrasound interpreted by me with pregnancy of unknown location.  Official radiology report(s): US OB LESS THAN 14 WEEKS WITH OB TRANSVAGINAL  Result Date: 09/03/2022 CLINICAL DATA:  Pregnancy with cramping EXAM: OBSTETRIC <14 WK [place] AND TRANSVAGINAL OB US TECHNIQUE: Both transabdominal and transvaginal ultrasound examinations were performed for complete evaluation of the gestation as well as the maternal uterus, adnexal regions, and pelvic cul-de-sac. Transvaginal technique was performed to assess early pregnancy. COMPARISON:  None of this gestation. FINDINGS: No intra or extra uterine gestational sac is seen. No adnexal mass. There is pelvic fluid which has a simple character and is small to moderate in volume. The endometrium measures 1 cm in maximal thickness and is homogeneous. IMPRESSION: 1. Pregnancy of unknown location. Differential considerations include intrauterine gestation too early to be sonographically visualized, spontaneous abortion, or ectopic  pregnancy. Consider follow-up ultrasound in 10 days and serial quantitative beta HCG follow-up. 2. Simple pelvic fluid. Electronically Signed   By: Tiburcio Pea M.D.   On: 09/03/2022 04:49    PROCEDURES and INTERVENTIONS:  Procedures  Medications  cephALEXin (KEFLEX) capsule 500 mg (500 mg Oral Given 09/03/22 0241)  acetaminophen (TYLENOL) tablet 1,000 mg (1,000 mg Oral Given 09/03/22 0241)     IMPRESSION / MDM / ASSESSMENT AND PLAN / ED COURSE  I reviewed the triage vital signs and the nursing notes.  Differential diagnosis includes, but is not limited to,   {Patient presents with symptoms of an acute illness or injury that is potentially life-threatening.  Patient presents with cramping associate with first trimester gestation and found to have evidence of pregnancy of unknown location suitable for outpatient management with close OB/GYN follow-up.  Reassuring exam with minimal suprapubic tenderness.  Blood work without leukocytosis, normal metabolic panel.  Urine with nitrites and small leukocytes but she has no symptoms.  Started on 3 days of Keflex to treat bacteriuria.  Serum hCG is only 800 and ultrasound with pregnancy of unknown location.  Suitable for outpatient management with close OB follow-up.  Discussed return precautions      FINAL CLINICAL IMPRESSION(S) / ED DIAGNOSES   Final diagnoses:  Pelvic pain  Nausea and vomiting, unspecified vomiting type     Rx / DC Orders   ED Discharge Orders          Ordered    cephALEXin (KEFLEX) 500 MG capsule  3 times daily        09/03/22 0202             Note:  This document was prepared using Dragon voice recognition software and may include unintentional dictation errors.   Delton Prairie, MD 09/03/22 [phone]

## 2022-09-03 NOTE — Discharge Instructions (Signed)
Go see your OBGYN within the next 2 weeks for a recheck. Return to the ED with any worsening symptoms.

## 2022-09-05 ENCOUNTER — Other Ambulatory Visit: Payer: Self-pay

## 2022-09-05 ENCOUNTER — Emergency Department
Admission: EM | Admit: 2022-09-05 | Discharge: 2022-09-05 | Disposition: A | Discharge status: Home / Self Care | Payer: Medicaid Other | Attending: Emergency Medicine | Admitting: Emergency Medicine

## 2022-09-05 ENCOUNTER — Emergency Department: Payer: Medicaid Other

## 2022-09-05 ENCOUNTER — Ambulatory Visit: Payer: Self-pay | Admitting: *Deleted

## 2022-09-05 DIAGNOSIS — Z3A01 Less than 8 weeks gestation of pregnancy: Secondary | ICD-10-CM | POA: Insufficient documentation

## 2022-09-05 DIAGNOSIS — O209 Hemorrhage in early pregnancy, unspecified: Secondary | ICD-10-CM | POA: Diagnosis present

## 2022-09-05 DIAGNOSIS — O4690 Antepartum hemorrhage, unspecified, unspecified trimester: Secondary | ICD-10-CM

## 2022-09-05 DIAGNOSIS — O3680X0 Pregnancy with inconclusive fetal viability, not applicable or unspecified: Secondary | ICD-10-CM

## 2022-09-05 LAB — BASIC METABOLIC PANEL
Anion gap: 8 (ref 5–15)
BUN: 10 mg/dL (ref 6–20)
CO2: 23 mmol/L (ref 22–32)
Calcium: 9 mg/dL (ref 8.9–10.3)
Chloride: 106 mmol/L (ref 98–111)
Creatinine, Ser: 0.74 mg/dL (ref 0.44–1.00)
GFR, Estimated: >60 mL/min (ref >60)
Glucose, Bld: 103 mg/dL — ABNORMAL HIGH (ref 70–99)
Potassium: 3.9 mmol/L (ref 3.5–5.1)
Sodium: 137 mmol/L (ref 135–145)

## 2022-09-05 LAB — URINE CULTURE: Culture: >=100000 — AB

## 2022-09-05 LAB — CBC
HCT: 34.2 % — ABNORMAL LOW (ref 36.0–46.0)
Hemoglobin: 10.9 g/dL — ABNORMAL LOW (ref 12.0–15.0)
MCH: 28.4 pg (ref 26.0–34.0)
MCHC: 31.9 g/dL (ref 30.0–36.0)
MCV: 89.1 fL (ref 80.0–100.0)
Platelets: 258 10*3/uL (ref 150–400)
RBC: 3.84 MIL/uL — ABNORMAL LOW (ref 3.87–5.11)
RDW: 15.2 % (ref 11.5–15.5)
WBC: 8.9 10*3/uL (ref 4.0–10.5)
nRBC: 0 % (ref 0.0–0.2)

## 2022-09-05 LAB — HCG, QUANTITATIVE, PREGNANCY: hCG, Beta Chain, Quant, S: 763 m[IU]/mL — ABNORMAL HIGH (ref <5)

## 2022-09-05 NOTE — ED Notes (Signed)
See triage notes. Patient stated she is having lower abdominal cramping and some back pain. Patient found out Monday she is pregnant. Had some blood today when using the bathroom.

## 2022-09-05 NOTE — Telephone Encounter (Signed)
Reason for Disposition  [1] Intermittent lower abdominal pain (e.g., cramping) AND [2] present > 24 hours    Having abd pain and lower back pain all day yesterday, last night and today now noticed blood when she wiped after urinating.  Answer Assessment - Initial Assessment Questions 1. AMOUNT: "Describe the bleeding that you are having."    - SPOTTING: spotting, or pinkish / brownish mucous discharge; does not fill panty liner or pad    - MILD:  less than 1 pad / hour; less than patient's usual menstrual bleeding   - MODERATE: 1-2 pads / hour; 1 menstrual cup every 6 hours; small-medium blood clots (e.g., pea, grape, small coin)   - SEVERE: soaking 2 or more pads/hour for 2 or more hours; 1 menstrual cup every 2 hours; bleeding not contained by pads or continuous red blood from vagina; large blood clots (e.g., golf ball, large coin)      I'm less than [redacted] weeks pregnant.    I'm having vaginal bleeding.    I was having cramping in my lower stomach.   I saw Dr. Katrinka Blazing and he told me I was pregnant.    Found out on 09/03/2022 that I'm less than 14 weeks with the ultrasound.   I had the ultrasound via my vagina and abd.    I was seen at [facility].   I called [facility] and the office is closed.    2. ONSET: "When did the bleeding begin?" "Is it continuing now?"     The bleeding started just now.   I urinated and I saw blood when I got up just now.    I'm having pain in my lower stomach and lower back.   This is number 4 pregnancy for me.   It's reddish brownish blood.    When I wipe it's coming from my vagina.    No constipation or diarrhea.  3. MENSTRUAL PERIOD: "When was the last normal menstrual period?" "How is this different than your period?"     Last period was last month.   It lasted 2 days.   It came a little early for me.    4. REGULARITY: "How regular are your periods?"     She is pregnant. 5. ABDOMEN PAIN: "Do you have any pain?" "How bad is the pain?"  (e.g., Scale 1-10; mild, moderate,  or severe)   - MILD (1-3): doesn't interfere with normal activities, abdomen soft and not tender to touch    - MODERATE (4-7): interferes with normal activities or awakens from sleep, abdomen tender to touch    - SEVERE (8-10): excruciating pain, doubled over, unable to do any normal activities      8/10    I had pain all yesterday and all night.    That's why I went to the hospital the other night was because of the pain in my stomach and lower back.    I asked what they told her.   They said I have a UTI.  I was given an antibiotic for that.    I'm not getting any better.    I was tossing and turning all night.   I could not get comfortable.    6. PREGNANCY: "Is there any chance you are pregnant?" "When was your last menstrual period?"     Yes less [redacted] weeks pregnant on the ultrasound.    That's how I found out I was pregnant was from  7. BREASTFEEDING: "Are you breastfeeding?"  No 8. HORMONE MEDICINES: "Are you taking any hormone medicines, prescription or over-the-counter?" (e.g., birth control pills, estrogen)     Not asked 9. BLOOD THINNER MEDICINES: "Do you take any blood thinners?" (e.g., Coumadin / warfarin, Pradaxa / dabigatran, aspirin)     Not asked 10. CAUSE: "What do you think is causing the bleeding?" (e.g., recent gyn surgery, recent gyn procedure; known bleeding disorder, cervical cancer, polycystic ovarian disease, fibroids)         I don't know    I'm less than [redacted] weeks pregnant on the ultrasound. 11. HEMODYNAMIC STATUS: "Are you weak or feeling lightheaded?" If Yes, ask: "Can you stand and walk normally?"        No 12. OTHER SYMPTOMS: "What other symptoms are you having with the bleeding?" (e.g., passed tissue, vaginal discharge, fever, menstrual-type cramps)       Just noticed the blood just now when I wiped after urinating.  I'm still having the lower abd pain and lower back pain I was having when I went to the hospital and found out I was pregnant.  Answer Assessment -  Initial Assessment Questions 1. ONSET: "When did this bleeding start?"       I just went to the bathroom and peed and when I wiped I noticed reddish brownish blood.   I'm less than [redacted] weeks pregnant per the ultrasound.   I went to the hospital the other night because I was having abd pain and lower back pain.   That's when I found out I was pregnant.   I also have a UTI.   I'm taking an antibiotic.   I tried to call my Ob/Gyn but the recording said the office is closed.  This is my 4th pregnancy.   I didn't have any complications with my prior pregnancies.    On the ultrasound they said it was so early in the pregnancy they could hardly see the baby that's why they said it was less than [redacted] weeks along.  My abd pain is 8/10.   I could not sleep last night.   All day yesterday and today I've been hurting.   This is the first time I've seen blood. 2. DESCRIPTION: "Describe the bleeding that you are having." "How much bleeding is there?"    - SPOTTING: spotting, or pinkish / brownish mucous discharge; does not fill panty liner or pad    - MILD:  less than 1 pad / hour; less than patient's usual menstrual bleeding   - MODERATE: 1-2 pads / hour; 1 menstrual cup every 6 hours; small-medium blood clots (e.g., pea, grape, small coin)   - SEVERE: soaking 2 or more pads/hour for 2 or more hours; 1 menstrual cup every 2 hours; bleeding not contained by pads or continuous red blood from vagina; large blood clots (e.g., golf ball, large coin)      Noticed the blood just now after urinating and wiping it was on the toilet paper. 3. ABDOMINAL PAIN SEVERITY: If present, ask: "How bad is it?"  (e.g., Scale 1-10; mild, moderate, or severe)   - MILD (1-3): doesn't interfere with normal activities, abdomen soft and not tender to touch    - MODERATE (4-7): interferes with normal activities or awakens from sleep, abdomen tender to touch    - SEVERE (8-10): excruciating pain, doubled over, unable to do any normal activities      8/10 since yesterday 4. PREGNANCY: "Do you know how many weeks or months pregnant you  are?" "When was the first day of your last normal menstrual period?"     Yes   Less than 14 weeks.   I just found out in the ED 2 nights ago.   I didn't even know I was pregnant. 5. HEMODYNAMIC STATUS: "Are you weak or feeling lightheaded?" If Yes, ask: "Can you stand and walk normally?"      No   walking normally 6. OTHER SYMPTOMS: "What other symptoms are you having with the bleeding?" (e.g., passed tissue, vaginal discharge, fever, menstrual-type cramps)     Still having the lower abd pain and lower back pain.   Denies any urinary symptoms when asked.   She is taking the antibiotic for the UTI as directed.  Protocols used: Vaginal Bleeding - Abnormal-A-AH, Pregnancy - Vaginal Bleeding Less Than [redacted] Weeks EGA-A-AH

## 2022-09-05 NOTE — Telephone Encounter (Signed)
  Chief Complaint: Less than [redacted] weeks pregnant and having vaginal bleeding Symptoms: lower abd pain and lower back pain all day yesterday and last night into today.   Frequency: Just now noticed the blood after urinating just now.    Her ob gyn office is closed when she tried to contact them. Pertinent Negatives: Patient denies any urinary symptoms though she is being treated for a UTI.   Seen in the ED for lower abd pain and lower back pain.   That's when she found out she was pregnant. Disposition: [x] ED /[] Urgent Care (no appt availability in office) / [] Appointment(In office/virtual)/ []  [facility]/ [] Home Care/ [] Refused Recommended Disposition /[]  Mobile Bus/ []  Follow-up with PCP Additional Notes: Since she cannot get in touch with her ob gyn, the office is closed I have referred her to the ED.  She thanked me for my help.

## 2022-09-05 NOTE — ED Triage Notes (Signed)
Pt to ED for vaginal bleeding that started an hour ago. States has not gone through any pads or tampons. Reports found out she was less than [redacted] weeks pregnant on 8/26. Having lower abd cramping.

## 2022-09-05 NOTE — ED Provider Notes (Signed)
[facility] Provider Note    Event Date/Time   First MD Initiated Contact with Patient 09/05/22 1453     (approximate)   History   Chief Complaint Vaginal Bleeding   HPI  Christie Green is a 27 y.o. female [id number] who presents to the ED complaining of vaginal bleeding.  Patient reports that she has had about 2 days of intermittent crampy pain in her pelvic area, was initially seen in the ED for this 2 days ago with ultrasound showing pregnancy of unknown anatomic location.  Crampy discomfort has been similar since then, but she developed bleeding earlier today.  She states that she only notices the bleeding when she goes to wipe, has not noticed any discharge.  She has not had any dysuria, hematuria, or flank pain.  She has an appointment coming up with her OB/GYN next month, believes her LMP was about 6 weeks ago.     Physical Exam   Triage Vital Signs: ED Triage Vitals  Encounter Vitals Group     BP 09/05/22 1417 129/62     Systolic BP Percentile --      Diastolic BP Percentile --      Pulse Rate 09/05/22 1414 77     Resp 09/05/22 1414 18     Temp 09/05/22 1414 98.5 F (36.9 C)     Temp src --      SpO2 09/05/22 1414 97 %     Weight 09/05/22 1416 215 lb (97.5 kg)     Height 09/05/22 1416 5\' 6"  (1.676 m)     Head Circumference --      Peak Flow --      Pain Score 09/05/22 1416 8     Pain Loc --      Pain Education --      Exclude from Growth Chart --     Most recent vital signs: Vitals:   09/05/22 1414 09/05/22 1417  BP:  129/62  Pulse: 77   Resp: 18   Temp: 98.5 F (36.9 C)   SpO2: 97%     Constitutional: Alert and oriented. Eyes: Conjunctivae are normal. Head: Atraumatic. Nose: No congestion/rhinnorhea. Mouth/Throat: Mucous membranes are moist.  Cardiovascular: Normal rate, regular rhythm. Grossly normal heart sounds.  2+ radial pulses bilaterally. Respiratory: Normal respiratory effort.  No retractions. Lungs  CTAB. Gastrointestinal: Soft and nontender. No distention. Musculoskeletal: No lower extremity tenderness nor edema.  Neurologic:  Normal speech and language. No gross focal neurologic deficits are appreciated.    ED Results / Procedures / Treatments   Labs (all labs ordered are listed, but only abnormal results are displayed) Labs Reviewed  CBC - Abnormal; Notable for the following components:      Result Value   RBC 3.84 (*)    Hemoglobin 10.9 (*)    HCT 34.2 (*)    All other components within normal limits  BASIC METABOLIC PANEL - Abnormal; Notable for the following components:   Glucose, Bld 103 (*)    All other components within normal limits  HCG, QUANTITATIVE, PREGNANCY - Abnormal; Notable for the following components:   hCG, Beta Chain, Quant, S 763 (*)    All other components within normal limits   RADIOLOGY Obstetric ultrasound reviewed and interpreted by me with no intrauterine pregnancy identified and no finding concerning for ectopic pregnancy.  PROCEDURES:  Critical Care performed: No  Procedures   MEDICATIONS ORDERED IN ED: Medications - No data to display   IMPRESSION / MDM /  ASSESSMENT AND PLAN / ED COURSE  I reviewed the triage vital signs and the nursing notes.                              27 y.o. female [id number] at approximately 6 weeks of pregnancy who presents to the ED complaining of intermittent crampy pain in her pelvic area for the past 2 days with some vaginal bleeding starting today.  Patient's presentation is most consistent with acute presentation with potential threat to life or bodily function.  Differential diagnosis includes, but is not limited to, ectopic pregnancy, threatened miscarriage, miscarriage, anemia.  Patient nontoxic-appearing and in no acute distress, vital signs are unremarkable.  Her abdominal exam is benign, beta-hCG level similar to 2 days ago and we will repeat ultrasound to ensure no evidence of ectopic pregnancy.   Labs are reassuring with no significant anemia, leukocytosis, showed abnormality, or AKI.  Patient is Rh+, no indication for RhoGAM.  Repeat ultrasound unable to identify intrauterine pregnancy, no findings concerning for ectopic pregnancy.  With slightly downtrending beta-hCG, I am concerned for miscarriage.  Patient with minimal pain or bleeding at this time and is appropriate for outpatient management, was counseled to follow-up with OB/GYN or here in the ED for repeat ultrasound and beta hCG levels in 7 to 10 days.  Patient agrees with plan.      FINAL CLINICAL IMPRESSION(S) / ED DIAGNOSES   Final diagnoses:  Vaginal bleeding in pregnancy  Pregnancy of unknown anatomic location     Rx / DC Orders   ED Discharge Orders     None        Note:  This document was prepared using Dragon voice recognition software and may include unintentional dictation errors.   Chesley Noon, MD 09/05/22 [phone]

## 2022-09-13 ENCOUNTER — Ambulatory Visit (INDEPENDENT_AMBULATORY_CARE_PROVIDER_SITE_OTHER): Payer: Medicaid Other | Admitting: Obstetrics & Gynecology

## 2022-09-13 ENCOUNTER — Encounter: Payer: Self-pay | Admitting: Obstetrics & Gynecology

## 2022-09-13 VITALS — BP 123/84 | HR 74 | Wt 261.0 lb

## 2022-09-13 DIAGNOSIS — O3680X0 Pregnancy with inconclusive fetal viability, not applicable or unspecified: Secondary | ICD-10-CM | POA: Diagnosis not present

## 2022-09-13 DIAGNOSIS — Z3A01 Less than 8 weeks gestation of pregnancy: Secondary | ICD-10-CM

## 2022-09-13 NOTE — Progress Notes (Signed)
    GYNECOLOGY PROGRESS NOTE  Subjective:    Patient ID: Christie Green, female    DOB: 1995/09/30, 27 y.o.   MRN: [id number]  HPI  Patient is a 27 y.o. single [id number] here today for follow up after ER visits 09/03/2022 and 09/05/2022 for bleeding in early pregnancy/pregnancy of unknown location. She reports that she quit vaginal bleeding about 3 days ago. She denies any pelvic pain.   Her QBHCG was 801 11 days ago and then 763 3 days after that (8 days ago).   The following portions of the patient's history were reviewed and updated as appropriate: allergies, current medications, past family history, past medical history, past social history, past surgical history, and problem list.  Review of Systems Pertinent items are noted in HPI.   Objective:   Blood pressure 123/84, pulse 74, weight 261 lb (118.4 kg), last menstrual period 08/02/2022, not currently breastfeeding. Body mass index is 42.13 kg/m.  Well nourished, well hydrated Black female, no apparent distress She is ambulating and conversing normally. Abd- obese, benign Bimanual exam - normal size and shape, anteverted uterus, normal adnexal exam, no pain with exam, no blood noted on exam glove    Assessment:   1. Pregnancy of unknown anatomic location      Plan:   1. Pregnancy of unknown anatomic location  - B-HCG Quant, stat today - I will notify her today of the results and management plan based on the result.

## 2022-09-14 LAB — BETA HCG QUANT (REF LAB): hCG Quant: 122 m[IU]/mL

## 2022-09-16 ENCOUNTER — Encounter: Payer: Self-pay | Admitting: Obstetrics & Gynecology

## 2022-09-23 ENCOUNTER — Other Ambulatory Visit: Payer: Self-pay

## 2022-09-23 ENCOUNTER — Telehealth: Payer: Self-pay | Admitting: Obstetrics & Gynecology

## 2022-09-23 NOTE — Telephone Encounter (Signed)
Reached out to pt to reschedule lab appt that was scheduled on 09/23/2022 at 9:20.  Left message for pt to call back.

## 2022-09-24 ENCOUNTER — Other Ambulatory Visit: Payer: Self-pay

## 2022-09-24 DIAGNOSIS — O3680X0 Pregnancy with inconclusive fetal viability, not applicable or unspecified: Secondary | ICD-10-CM

## 2022-09-24 NOTE — Telephone Encounter (Signed)
Reached out to pt (2x) tor reschedule lab appt that was scheduled on 09/23/2022 at 9:20.  Was able to reschedule the appt for 09/24/2022 at 2:20 for labs.

## 2022-09-28 ENCOUNTER — Emergency Department: Payer: Medicaid Other

## 2022-09-28 ENCOUNTER — Other Ambulatory Visit: Payer: Self-pay

## 2022-09-28 ENCOUNTER — Emergency Department
Admission: EM | Admit: 2022-09-28 | Discharge: 2022-09-28 | Disposition: A | Discharge status: Home / Self Care | Payer: Medicaid Other | Attending: Emergency Medicine | Admitting: Emergency Medicine

## 2022-09-28 DIAGNOSIS — R102 Pelvic and perineal pain: Secondary | ICD-10-CM | POA: Diagnosis not present

## 2022-09-28 DIAGNOSIS — R1011 Right upper quadrant pain: Secondary | ICD-10-CM | POA: Insufficient documentation

## 2022-09-28 DIAGNOSIS — O039 Complete or unspecified spontaneous abortion without complication: Secondary | ICD-10-CM | POA: Insufficient documentation

## 2022-09-28 DIAGNOSIS — R109 Unspecified abdominal pain: Secondary | ICD-10-CM

## 2022-09-28 LAB — COMPREHENSIVE METABOLIC PANEL
ALT: 22 U/L (ref 0–44)
AST: 22 U/L (ref 15–41)
Albumin: 3.8 g/dL (ref 3.5–5.0)
Alkaline Phosphatase: 115 U/L (ref 38–126)
Anion gap: 10 (ref 5–15)
BUN: 12 mg/dL (ref 6–20)
CO2: 21 mmol/L — ABNORMAL LOW (ref 22–32)
Calcium: 8.9 mg/dL (ref 8.9–10.3)
Chloride: 105 mmol/L (ref 98–111)
Creatinine, Ser: 0.77 mg/dL (ref 0.44–1.00)
GFR, Estimated: >60 mL/min (ref >60)
Glucose, Bld: 103 mg/dL — ABNORMAL HIGH (ref 70–99)
Potassium: 4 mmol/L (ref 3.5–5.1)
Sodium: 136 mmol/L (ref 135–145)
Total Bilirubin: 0.4 mg/dL (ref 0.3–1.2)
Total Protein: 8.2 g/dL — ABNORMAL HIGH (ref 6.5–8.1)

## 2022-09-28 LAB — CBC WITH DIFFERENTIAL/PLATELET
Abs Immature Granulocytes: 0.02 10*3/uL (ref 0.00–0.07)
Basophils Absolute: 0 10*3/uL (ref 0.0–0.1)
Basophils Relative: 0 %
Eosinophils Absolute: 0.2 10*3/uL (ref 0.0–0.5)
Eosinophils Relative: 2 %
HCT: 34 % — ABNORMAL LOW (ref 36.0–46.0)
Hemoglobin: 10.9 g/dL — ABNORMAL LOW (ref 12.0–15.0)
Immature Granulocytes: 0 %
Lymphocytes Relative: 38 %
Lymphs Abs: 3.9 10*3/uL (ref 0.7–4.0)
MCH: 27.7 pg (ref 26.0–34.0)
MCHC: 32.1 g/dL (ref 30.0–36.0)
MCV: 86.5 fL (ref 80.0–100.0)
Monocytes Absolute: 0.7 10*3/uL (ref 0.1–1.0)
Monocytes Relative: 7 %
Neutro Abs: 5.4 10*3/uL (ref 1.7–7.7)
Neutrophils Relative %: 53 %
Platelets: 292 10*3/uL (ref 150–400)
RBC: 3.93 MIL/uL (ref 3.87–5.11)
RDW: 14.9 % (ref 11.5–15.5)
WBC: 10.2 10*3/uL (ref 4.0–10.5)
nRBC: 0 % (ref 0.0–0.2)

## 2022-09-28 LAB — LIPASE, BLOOD: Lipase: 29 U/L (ref 11–51)

## 2022-09-28 LAB — HCG, QUANTITATIVE, PREGNANCY: hCG, Beta Chain, Quant, S: 34 m[IU]/mL — ABNORMAL HIGH (ref <5)

## 2022-09-28 MED ORDER — ONDANSETRON HCL 4 MG/2ML IJ SOLN
4.0000 mg | Freq: Once | INTRAMUSCULAR | Status: AC
  Administered 2022-09-28: 4 mg via INTRAVENOUS
  Filled 2022-09-28: qty 2

## 2022-09-28 MED ORDER — ONDANSETRON 4 MG PO TBDP: 4.0000 mg | ORAL_TABLET | Freq: Three times a day (TID) | ORAL | 0 refills | Status: DC | PRN

## 2022-09-28 MED ORDER — SODIUM CHLORIDE 0.9 % IV BOLUS
1000.0000 mL | Freq: Once | INTRAVENOUS | Status: AC
  Administered 2022-09-28: 1000 mL via INTRAVENOUS

## 2022-09-28 MED ORDER — MORPHINE SULFATE (PF) 4 MG/ML IV SOLN
4.0000 mg | Freq: Once | INTRAVENOUS | Status: AC
  Administered 2022-09-28: 4 mg via INTRAVENOUS
  Filled 2022-09-28: qty 1

## 2022-09-28 MED ORDER — OXYCODONE-ACETAMINOPHEN 5-325 MG PO TABS: 1.0000 | ORAL_TABLET | ORAL | 0 refills | Status: DC | PRN
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
## 2022-09-28 NOTE — Discharge Instructions (Signed)
You may take pain and nausea medicines as needed (Percocet/Zofran).  Drink plenty of fluids this weekend.  Follow-up with your OB/GYN on Monday.  Return to the ER for worsening symptoms, persistent vomiting, soaking more than 1 pad per hour, fainting, or other concerns.

## 2022-09-28 NOTE — ED Triage Notes (Signed)
Patient arrives via EMS from home with complaint of abdominal pain which has been going on for about a half hour before EMS arrival.  Patient is pregnant and says she passed clots last week.  She says the pain is 10/10 and aching.

## 2022-09-30 ENCOUNTER — Telehealth: Payer: Self-pay | Admitting: Obstetrics

## 2022-09-30 ENCOUNTER — Other Ambulatory Visit: Payer: Self-pay | Admitting: Obstetrics & Gynecology

## 2022-09-30 ENCOUNTER — Ambulatory Visit (INDEPENDENT_AMBULATORY_CARE_PROVIDER_SITE_OTHER): Payer: Self-pay | Admitting: Obstetrics & Gynecology

## 2022-09-30 ENCOUNTER — Encounter: Payer: Self-pay | Admitting: Obstetrics & Gynecology

## 2022-09-30 VITALS — BP 112/72 | HR 75 | Ht 67.0 in | Wt 264.0 lb

## 2022-09-30 DIAGNOSIS — O039 Complete or unspecified spontaneous abortion without complication: Secondary | ICD-10-CM | POA: Diagnosis not present

## 2022-09-30 DIAGNOSIS — Z3A01 Less than 8 weeks gestation of pregnancy: Secondary | ICD-10-CM | POA: Diagnosis not present

## 2022-09-30 NOTE — Progress Notes (Signed)
GYNECOLOGY PROGRESS NOTE  Subjective:    Patient ID: Christie Green, female    DOB: 1995/02/28, 27 y.o.   MRN: [id number]  HPI  Patient is a 27 y.o. [name] is here because the ER doc told her to follow up here. She has been seen here this month as she is having a miscarriage. She is not currently bleeding. Her QBHCG was down to 34 at the ER on 09/28/2022. She had sex 2 days ago. She would like another pregnancy. She is taking PNVs. She has no complaints today.  The following portions of the patient's history were reviewed and updated as appropriate: allergies, current medications, past family history, past medical history, past social history, past surgical history, and problem list.  Review of Systems Pertinent items are noted in HPI.  Pap smear was normal this year.  Objective:   Blood pressure 112/72, pulse 75, height 5\' 7"  (1.702 m), weight 264 lb (119.7 kg), last menstrual period 08/02/2022, not currently breastfeeding. Body mass index is 41.35 kg/m. Well nourished, well hydrated Black female, no apparent distress She is ambulating and conversing normally. Exam deferred   Assessment:   Miscarriage in process  Plan:   She was advised to stay abstinent until [facility] is at zero. She will come back in a week for a blood draw.

## 2022-09-30 NOTE — Telephone Encounter (Signed)
Reached out to pt to schedule appt with MD on Mon, Tues., or Wed for ED SAB follow up, possible retained products.  Left message for pt to call back to schedule.

## 2022-09-30 NOTE — Progress Notes (Signed)
QBHCG ordered for next week I sent a Mychart message telling her to come in for the blood draw.

## 2022-09-30 NOTE — Telephone Encounter (Signed)
Pt is scheduled to see Dr. Marice Potter at 10:15 on 09/30/2022.

## 2022-10-01 ENCOUNTER — Other Ambulatory Visit: Payer: Self-pay

## 2022-10-01 DIAGNOSIS — O99611 Diseases of the digestive system complicating pregnancy, first trimester: Secondary | ICD-10-CM | POA: Insufficient documentation

## 2022-10-01 DIAGNOSIS — K3580 Unspecified acute appendicitis: Secondary | ICD-10-CM | POA: Insufficient documentation

## 2022-10-01 DIAGNOSIS — K661 Hemoperitoneum: Secondary | ICD-10-CM | POA: Diagnosis not present

## 2022-10-01 DIAGNOSIS — O00101 Right tubal pregnancy without intrauterine pregnancy: Secondary | ICD-10-CM | POA: Insufficient documentation

## 2022-10-01 DIAGNOSIS — R1031 Right lower quadrant pain: Secondary | ICD-10-CM | POA: Diagnosis present

## 2022-10-01 DIAGNOSIS — Z87891 Personal history of nicotine dependence: Secondary | ICD-10-CM | POA: Insufficient documentation

## 2022-10-01 LAB — CBC
HCT: 33.6 % — ABNORMAL LOW (ref 36.0–46.0)
Hemoglobin: 10.5 g/dL — ABNORMAL LOW (ref 12.0–15.0)
MCH: 27.2 pg (ref 26.0–34.0)
MCHC: 31.3 g/dL (ref 30.0–36.0)
MCV: 87 fL (ref 80.0–100.0)
Platelets: 285 10*3/uL (ref 150–400)
RBC: 3.86 MIL/uL — ABNORMAL LOW (ref 3.87–5.11)
RDW: 15 % (ref 11.5–15.5)
WBC: 11.3 10*3/uL — ABNORMAL HIGH (ref 4.0–10.5)
nRBC: 0 % (ref 0.0–0.2)

## 2022-10-01 NOTE — ED Triage Notes (Signed)
Pt presents via EMS c/o abd pain. Reports was seen here x3 days ago and states had a miscarriage. Reports small amount of vaginal bleeding yesterday but denies currently. Reports mid abd pain to lower right side of abd,.

## 2022-10-02 ENCOUNTER — Encounter
Admission: EM | Disposition: A | Discharge status: Home / Self Care | Payer: Self-pay | Source: Home / Self Care | Attending: Emergency Medicine

## 2022-10-02 ENCOUNTER — Emergency Department: Payer: Medicaid Other | Admitting: Certified Registered Nurse Anesthetist

## 2022-10-02 ENCOUNTER — Emergency Department: Payer: Medicaid Other

## 2022-10-02 ENCOUNTER — Ambulatory Visit
Admission: EM | Admit: 2022-10-02 | Discharge: 2022-10-02 | Disposition: A | Discharge status: Home / Self Care | Payer: Medicaid Other | Attending: Emergency Medicine | Admitting: Emergency Medicine

## 2022-10-02 ENCOUNTER — Other Ambulatory Visit: Payer: Self-pay

## 2022-10-02 DIAGNOSIS — Z3A01 Less than 8 weeks gestation of pregnancy: Secondary | ICD-10-CM | POA: Diagnosis not present

## 2022-10-02 DIAGNOSIS — K661 Hemoperitoneum: Secondary | ICD-10-CM | POA: Insufficient documentation

## 2022-10-02 DIAGNOSIS — N9489 Other specified conditions associated with female genital organs and menstrual cycle: Secondary | ICD-10-CM | POA: Insufficient documentation

## 2022-10-02 DIAGNOSIS — O00201 Right ovarian pregnancy without intrauterine pregnancy: Secondary | ICD-10-CM | POA: Diagnosis not present

## 2022-10-02 DIAGNOSIS — R1031 Right lower quadrant pain: Secondary | ICD-10-CM

## 2022-10-02 HISTORY — PX: DIAGNOSTIC LAPAROSCOPY WITH REMOVAL OF ECTOPIC PREGNANCY: SHX6449

## 2022-10-02 HISTORY — PX: LAPAROSCOPIC APPENDECTOMY: SHX408

## 2022-10-02 LAB — COMPREHENSIVE METABOLIC PANEL
ALT: 20 U/L (ref 0–44)
AST: 19 U/L (ref 15–41)
Albumin: 3.9 g/dL (ref 3.5–5.0)
Alkaline Phosphatase: 99 U/L (ref 38–126)
Anion gap: 6 (ref 5–15)
BUN: 10 mg/dL (ref 6–20)
CO2: 26 mmol/L (ref 22–32)
Calcium: 8.9 mg/dL (ref 8.9–10.3)
Chloride: 105 mmol/L (ref 98–111)
Creatinine, Ser: 0.77 mg/dL (ref 0.44–1.00)
GFR, Estimated: >60 mL/min (ref >60)
Glucose, Bld: 98 mg/dL (ref 70–99)
Potassium: 3.6 mmol/L (ref 3.5–5.1)
Sodium: 137 mmol/L (ref 135–145)
Total Bilirubin: 0.7 mg/dL (ref 0.3–1.2)
Total Protein: 7.8 g/dL (ref 6.5–8.1)

## 2022-10-02 LAB — URINALYSIS, ROUTINE W REFLEX MICROSCOPIC
Bilirubin Urine: NEGATIVE
Glucose, UA: NEGATIVE mg/dL
Ketones, ur: NEGATIVE mg/dL
Leukocytes,Ua: NEGATIVE
Nitrite: POSITIVE — AB
Protein, ur: 30 mg/dL — AB
Specific Gravity, Urine: 1.021 (ref 1.005–1.030)
pH: 7 (ref 5.0–8.0)

## 2022-10-02 LAB — HCG, QUANTITATIVE, PREGNANCY: hCG, Beta Chain, Quant, S: 29 m[IU]/mL — ABNORMAL HIGH (ref <5)

## 2022-10-02 LAB — TYPE AND SCREEN
ABO/RH(D): O POS
Antibody Screen: NEGATIVE

## 2022-10-02 LAB — LIPASE, BLOOD: Lipase: 29 U/L (ref 11–51)

## 2022-10-02 SURGERY — LAPAROSCOPY, WITH ECTOPIC PREGNANCY SURGICAL TREATMENT
Anesthesia: General | Laterality: Right

## 2022-10-02 MED ORDER — LIDOCAINE HCL (CARDIAC) PF 100 MG/5ML IV SOSY
PREFILLED_SYRINGE | INTRAVENOUS | Status: DC | PRN
  Administered 2022-10-02: 100 mg via INTRAVENOUS

## 2022-10-02 MED ORDER — PROPOFOL 10 MG/ML IV BOLUS
INTRAVENOUS | Status: DC | PRN
Start: 2022-10-02 — End: 2022-10-02
  Administered 2022-10-02: 150 mg via INTRAVENOUS

## 2022-10-02 MED ORDER — 0.9 % SODIUM CHLORIDE (POUR BTL) OPTIME
TOPICAL | Status: DC | PRN
  Administered 2022-10-02: 450 mL

## 2022-10-02 MED ORDER — PROPOFOL 10 MG/ML IV BOLUS
INTRAVENOUS | Status: AC
  Filled 2022-10-02: qty 20

## 2022-10-02 MED ORDER — DEXAMETHASONE SODIUM PHOSPHATE 10 MG/ML IJ SOLN
INTRAMUSCULAR | Status: AC
  Filled 2022-10-02: qty 1

## 2022-10-02 MED ORDER — PHENYLEPHRINE 80 MCG/ML (10ML) SYRINGE FOR IV PUSH (FOR BLOOD PRESSURE SUPPORT)
PREFILLED_SYRINGE | INTRAVENOUS | Status: DC | PRN
  Administered 2022-10-02 (×2): 80 ug via INTRAVENOUS
  Administered 2022-10-02: 40 ug via INTRAVENOUS

## 2022-10-02 MED ORDER — MORPHINE SULFATE (PF) 4 MG/ML IV SOLN
4.0000 mg | Freq: Once | INTRAVENOUS | Status: AC
  Administered 2022-10-02: 4 mg via INTRAVENOUS
  Filled 2022-10-02: qty 1

## 2022-10-02 MED ORDER — ACETAMINOPHEN 10 MG/ML IV SOLN
INTRAVENOUS | Status: AC
  Filled 2022-10-02: qty 100

## 2022-10-02 MED ORDER — ONDANSETRON HCL 4 MG/2ML IJ SOLN
INTRAMUSCULAR | Status: DC | PRN
  Administered 2022-10-02: 4 mg via INTRAVENOUS

## 2022-10-02 MED ORDER — IBUPROFEN 600 MG PO TABS: 600.0000 mg | ORAL_TABLET | Freq: Four times a day (QID) | ORAL | 3 refills | Status: DC | PRN

## 2022-10-02 MED ORDER — CHLORHEXIDINE GLUCONATE 0.12 % MT SOLN
OROMUCOSAL | Status: AC
  Filled 2022-10-02: qty 15

## 2022-10-02 MED ORDER — KETOROLAC TROMETHAMINE 30 MG/ML IJ SOLN
INTRAMUSCULAR | Status: AC
  Filled 2022-10-02: qty 1

## 2022-10-02 MED ORDER — ORAL CARE MOUTH RINSE
15.0000 mL | Freq: Once | OROMUCOSAL | Status: AC
  Filled 2022-10-02: qty 15

## 2022-10-02 MED ORDER — FENTANYL CITRATE (PF) 100 MCG/2ML IJ SOLN
INTRAMUSCULAR | Status: AC
  Filled 2022-10-02: qty 2

## 2022-10-02 MED ORDER — SUGAMMADEX SODIUM 200 MG/2ML IV SOLN
INTRAVENOUS | Status: DC | PRN
  Administered 2022-10-02: 250 mg via INTRAVENOUS

## 2022-10-02 MED ORDER — DROPERIDOL 2.5 MG/ML IJ SOLN: 0.6250 mg | Freq: Once | INTRAMUSCULAR | Status: DC | PRN

## 2022-10-02 MED ORDER — ONDANSETRON HCL 4 MG/2ML IJ SOLN: 4.0000 mg | Freq: Four times a day (QID) | INTRAMUSCULAR | Status: DC | PRN

## 2022-10-02 MED ORDER — MIDAZOLAM HCL 2 MG/2ML IJ SOLN
INTRAMUSCULAR | Status: DC | PRN
  Administered 2022-10-02: 2 mg via INTRAVENOUS

## 2022-10-02 MED ORDER — DEXAMETHASONE SODIUM PHOSPHATE 10 MG/ML IJ SOLN
INTRAMUSCULAR | Status: DC | PRN
  Administered 2022-10-02: 10 mg via INTRAVENOUS

## 2022-10-02 MED ORDER — LACTATED RINGERS IV SOLN: INTRAVENOUS | Status: DC

## 2022-10-02 MED ORDER — OXYCODONE-ACETAMINOPHEN 5-325 MG PO TABS: 1.0000 | ORAL_TABLET | ORAL | Status: DC | PRN

## 2022-10-02 MED ORDER — FENTANYL CITRATE (PF) 100 MCG/2ML IJ SOLN
INTRAMUSCULAR | Status: DC | PRN
  Administered 2022-10-02: 25 ug via INTRAVENOUS
  Administered 2022-10-02: 100 ug via INTRAVENOUS
  Administered 2022-10-02: 50 ug via INTRAVENOUS
  Administered 2022-10-02: 25 ug via INTRAVENOUS

## 2022-10-02 MED ORDER — HYDROCODONE-ACETAMINOPHEN 5-325 MG PO TABS: 1.0000 | ORAL_TABLET | Freq: Four times a day (QID) | ORAL | 0 refills | Status: DC | PRN

## 2022-10-02 MED ORDER — CHLORHEXIDINE GLUCONATE 0.12 % MT SOLN
15.0000 mL | Freq: Once | OROMUCOSAL | Status: AC
  Administered 2022-10-02: 15 mL via OROMUCOSAL

## 2022-10-02 MED ORDER — FENTANYL CITRATE (PF) 100 MCG/2ML IJ SOLN
25.0000 ug | INTRAMUSCULAR | Status: DC | PRN
  Administered 2022-10-02 (×2): 50 ug via INTRAVENOUS

## 2022-10-02 MED ORDER — KETOROLAC TROMETHAMINE 30 MG/ML IJ SOLN
30.0000 mg | Freq: Once | INTRAMUSCULAR | Status: AC
  Administered 2022-10-02: 30 mg via INTRAVENOUS

## 2022-10-02 MED ORDER — OXYCODONE HCL 5 MG PO TABS
5.0000 mg | ORAL_TABLET | Freq: Once | ORAL | Status: AC
  Administered 2022-10-02: 5 mg via ORAL

## 2022-10-02 MED ORDER — SODIUM CHLORIDE 0.9 % IV BOLUS: 1000.0000 mL | Freq: Once | INTRAVENOUS | Status: DC

## 2022-10-02 MED ORDER — DEXMEDETOMIDINE HCL IN NACL 80 MCG/20ML IV SOLN
INTRAVENOUS | Status: DC | PRN
Start: 2022-10-02 — End: 2022-10-02
  Administered 2022-10-02 (×3): 8 ug via INTRAVENOUS
  Administered 2022-10-02: 4 ug via INTRAVENOUS

## 2022-10-02 MED ORDER — ONDANSETRON HCL 4 MG/2ML IJ SOLN
4.0000 mg | Freq: Once | INTRAMUSCULAR | Status: AC
  Administered 2022-10-02: 4 mg via INTRAVENOUS
  Filled 2022-10-02: qty 2

## 2022-10-02 MED ORDER — MIDAZOLAM HCL 2 MG/2ML IJ SOLN
INTRAMUSCULAR | Status: AC
  Filled 2022-10-02: qty 2

## 2022-10-02 MED ORDER — IOHEXOL 300 MG/ML  SOLN
100.0000 mL | Freq: Once | INTRAMUSCULAR | Status: AC | PRN
  Administered 2022-10-02: 100 mL via INTRAVENOUS

## 2022-10-02 MED ORDER — ROCURONIUM BROMIDE 100 MG/10ML IV SOLN
INTRAVENOUS | Status: DC | PRN
  Administered 2022-10-02 (×2): 10 mg via INTRAVENOUS
  Administered 2022-10-02: 60 mg via INTRAVENOUS

## 2022-10-02 MED ORDER — OXYCODONE HCL 5 MG PO TABS
ORAL_TABLET | ORAL | Status: AC
  Filled 2022-10-02: qty 1

## 2022-10-02 MED ORDER — ACETAMINOPHEN 10 MG/ML IV SOLN
INTRAVENOUS | Status: DC | PRN
  Administered 2022-10-02: 1000 mg via INTRAVENOUS

## 2022-10-02 MED ORDER — ONDANSETRON HCL 4 MG/2ML IJ SOLN
INTRAMUSCULAR | Status: AC
  Filled 2022-10-02: qty 2

## 2022-10-02 MED ORDER — BUPIVACAINE HCL 0.5 % IJ SOLN
INTRAMUSCULAR | Status: DC | PRN
  Administered 2022-10-02: 20 mL

## 2022-10-02 MED ORDER — DEXTROSE IN LACTATED RINGERS 5 % IV SOLN: INTRAVENOUS | Status: DC

## 2022-10-02 MED ORDER — ONDANSETRON 4 MG PO TBDP: 4.0000 mg | ORAL_TABLET | Freq: Four times a day (QID) | ORAL | Status: DC | PRN

## 2022-10-02 MED ORDER — POVIDONE-IODINE 10 % EX SWAB
2.0000 | Freq: Once | CUTANEOUS | Status: AC
  Administered 2022-10-02: 2 via TOPICAL
  Filled 2022-10-02: qty 4

## 2022-10-02 MED ORDER — LACTATED RINGERS IV BOLUS
1000.0000 mL | Freq: Once | INTRAVENOUS | Status: AC
  Administered 2022-10-02: 1000 mL via INTRAVENOUS

## 2022-10-02 SURGICAL SUPPLY — 57 items
ADH SKN CLS APL DERMABOND .7 (GAUZE/BANDAGES/DRESSINGS) ×2
ANCHOR TIS RET SYS 235ML (MISCELLANEOUS) IMPLANT
APL PRP STRL LF DISP 70% ISPRP (MISCELLANEOUS) ×2
BAG DECANTER FOR FLEXI CONT (MISCELLANEOUS) ×2 IMPLANT
BAG TISS RTRVL C235 10X14 (MISCELLANEOUS) ×4
BLADE SURG 15 STRL LF DISP TIS (BLADE) ×2 IMPLANT
BLADE SURG 15 STRL SS (BLADE) ×2
BLADE SURG SZ11 CARB STEEL (BLADE) ×2 IMPLANT
CATH ROBINSON RED A/P 16FR (CATHETERS) ×2 IMPLANT
CHLORAPREP W/TINT 26 (MISCELLANEOUS) ×2 IMPLANT
CUTTER FLEX LINEAR 45M (STAPLE) IMPLANT
DERMABOND ADVANCED .7 DNX12 (GAUZE/BANDAGES/DRESSINGS) IMPLANT
ELECT REM PT RETURN 9FT ADLT (ELECTROSURGICAL) ×2
ELECTRODE REM PT RTRN 9FT ADLT (ELECTROSURGICAL) ×2 IMPLANT
GAUZE 4X4 16PLY ~~LOC~~+RFID DBL (SPONGE) ×2 IMPLANT
GLOVE BIO SURGEON STRL SZ8 (GLOVE) ×2 IMPLANT
GLOVE PI ORTHO PRO STRL 7.5 (GLOVE) ×4 IMPLANT
GOWN STRL REUS W/ TWL LRG LVL3 (GOWN DISPOSABLE) ×4 IMPLANT
GOWN STRL REUS W/TWL LRG LVL3 (GOWN DISPOSABLE) ×4
GOWN STRL REUS W/TWL XL LVL4 (GOWN DISPOSABLE) ×4 IMPLANT
GRASPER SUT TROCAR 14GX15 (MISCELLANEOUS) IMPLANT
IRRIGATION STRYKERFLOW (MISCELLANEOUS) IMPLANT
IRRIGATOR STRYKERFLOW (MISCELLANEOUS) ×2
IV LACTATED RINGERS 1000ML (IV SOLUTION) ×2 IMPLANT
KIT PINK PAD W/HEAD ARE REST (MISCELLANEOUS) ×2 IMPLANT
KIT PINK PAD W/HEAD ARM REST (MISCELLANEOUS) ×2 IMPLANT
KIT TURNOVER CYSTO (KITS) ×2 IMPLANT
LIGASURE LAP MARYLAND 5MM 37CM (ELECTROSURGICAL) IMPLANT
MANIFOLD NEPTUNE II (INSTRUMENTS) ×2 IMPLANT
NDL HYPO 22X1.5 SAFETY MO (MISCELLANEOUS) ×2 IMPLANT
NEEDLE HYPO 22X1.5 SAFETY MO (MISCELLANEOUS) ×2 IMPLANT
NS IRRIG 500ML POUR BTL (IV SOLUTION) ×2 IMPLANT
PACK GYN LAPAROSCOPIC (MISCELLANEOUS) ×2 IMPLANT
PAD OB MATERNITY 4.3X12.25 (PERSONAL CARE ITEMS) ×2 IMPLANT
PAD PREP OB/GYN DISP 24X41 (PERSONAL CARE ITEMS) ×2 IMPLANT
RELOAD STAPLE 45 3.5 BLU ETS (ENDOMECHANICALS) IMPLANT
RELOAD STAPLE TA45 3.5 REG BLU (ENDOMECHANICALS) ×2 IMPLANT
SCRUB CHG 4% DYNA-HEX 4OZ (MISCELLANEOUS) ×2 IMPLANT
SET TUBE SMOKE EVAC HIGH FLOW (TUBING) ×2 IMPLANT
SLEEVE Z-THREAD 5X100MM (TROCAR) IMPLANT
STRIP CLOSURE SKIN 1/2X4 (GAUZE/BANDAGES/DRESSINGS) ×2 IMPLANT
SUT VIC AB 3-0 SH 27 (SUTURE)
SUT VIC AB 3-0 SH 27X BRD (SUTURE) IMPLANT
SUT VICRYL 0 UR6 27IN ABS (SUTURE) ×2 IMPLANT
SUT VICRYL 4-0 27 PS-2 BARIAT (SUTURE) ×2
SUTURE VICRYL 4-0 27 PS-2 BART (SUTURE) ×2 IMPLANT
SYS BAG RETRIEVAL 10MM (BASKET) ×2
SYSTEM BAG RETRIEVAL 10MM (BASKET) ×2 IMPLANT
SYSTEM WECK SHIELD CLOSURE (TROCAR) IMPLANT
TOWEL OR 17X26 4PK STRL BLUE (TOWEL DISPOSABLE) ×2 IMPLANT
TRAP FLUID SMOKE EVACUATOR (MISCELLANEOUS) ×2 IMPLANT
TROCAR BALLN GELPORT 12X130M (ENDOMECHANICALS) IMPLANT
TROCAR XCEL UNIV SLVE 11M 100M (ENDOMECHANICALS) IMPLANT
TROCAR Z-THRD FIOS HNDL 11X100 (TROCAR) IMPLANT
TROCAR Z-THREAD FIOS 5X100MM (TROCAR) ×2 IMPLANT
TUBING CONNECTING 10 (TUBING) ×2 IMPLANT
WATER STERILE IRR 500ML POUR (IV SOLUTION) ×2 IMPLANT

## 2022-10-02 NOTE — ED Notes (Signed)
Dr. Logan Bores OBGYN at bedside

## 2022-10-02 NOTE — Discharge Instructions (Signed)

## 2022-10-02 NOTE — Op Note (Signed)
     OPERATIVE NOTE 10/02/2022 12:38 PM  PRE-OPERATIVE DIAGNOSIS:  1) possible ectopic pregnancy 2) right adnexal mass 3) hemoperitoneum  POST-OPERATIVE DIAGNOSIS:  1) same with dilated right fallopian tube 2) appendix confluent with dilated right fallopian tube  OPERATION:  Laparoscopic right salpingectomy for ectopic, evacuation of hemoperitoneum, appendectomy (Dr. Tonna Boehringer)  SURGEON(S): Surgeons and Role: Panel 1:    Linzie Collin, MD - Primary    Tonna Boehringer, Isami, DO Panel 2:    Tonna Boehringer, Isami, DO - Primary   ANESTHESIA: Choice  ESTIMATED BLOOD LOSS: 50 mL  OPERATIVE FINDINGS: Patient found to have significant hemoperitoneum with grossly dilated right fallopian tube attached to the appendix.  Normal-appearing left fallopian tube and bilateral ovaries  SPECIMEN:  ID Type Source Tests Collected by Time Destination  1 : right tube with suspected ectopic Tissue PATH Gyn benign resection SURGICAL PATHOLOGY Linzie Collin, MD 10/02/2022 1031   2 : appendix Tissue PATH Appendix SURGICAL PATHOLOGY Linzie Collin, MD 10/02/2022 1114     COMPLICATIONS: None  DISPOSITION: Stable to recovery room  DESCRIPTION OF PROCEDURE:      The patient was prepped and draped in the dorsolithotomy position and placed under general anesthesia. The bladder was emptied. The cervix was grasped with a multi-toothed tenaculum and a uterine manipulator was placed within the cervical os respecting the position and curvature of the uterus. After changing gloves we proceeded abdominally. A small infraumbilical incision was made and a 5 mm trocar port was placed within the abdominopelvic cavity. The opening pressure was less than 7 mmHg.  Approximately 3 and 1/2 L of carbon dioxide gas was instilled within the abdominal pelvic cavity. The laparoscope was placed and the pelvis and abdomen were carefully inspected.  Under direct visualization right and left lower quadrant ports were placed.  Extensive  hemoperitoneum was evacuated.The infundibulopelvic ligaments were carefully identified. The ureters were identified out of the operative field.  The left fallopian tube appeared normal.  Both ovaries appeared normal.  The right fallopian tube was elevated and the mesenteric side systematically coagulated.  The tube was divided and removed.  Hemostasis was excellent.  The right tubal specimen was placed in an endocatch bag. The bag was removed through the L lower quadrant port.  The appendix was inspected and found to have what appeared to be some irregularity of the distal tip where it was attached to the ectopic.  Dr. Tonna Boehringer was called for consultation.  We were unable to determine if there was damage to the appendix or if there were any abnormalities of the appendix and it was decided that appendectomy was warranted. (Please see his dictation of that part of the procedure as well as closure of the ports.) Hemostasis of all areas of the pelvis was noted.  At this point Dr. Mylinda Latina took over the remainder of the case.  Elonda Husky, M.D. 10/02/2022 12:38 PM

## 2022-10-02 NOTE — Anesthesia Procedure Notes (Signed)
Procedure Name: Intubation Date/Time: 10/02/2022 9:34 AM  Performed by: Karoline Caldwell, CRNAPre-anesthesia Checklist: Patient identified, Patient being monitored, Timeout performed, Emergency Drugs available and Suction available Patient Re-evaluated:Patient Re-evaluated prior to induction Oxygen Delivery Method: Circle system utilized Preoxygenation: Pre-oxygenation with 100% oxygen Induction Type: IV induction Ventilation: Mask ventilation without difficulty and Oral airway inserted - appropriate to patient size Laryngoscope Size: 3 and McGraph Grade View: Grade I Tube type: Oral Tube size: 7.0 mm Number of attempts: 1 Airway Equipment and Method: Stylet Placement Confirmation: ETT inserted through vocal cords under direct vision, positive ETCO2 and breath sounds checked- equal and bilateral Secured at: 21 cm Tube secured with: Tape Dental Injury: Teeth and Oropharynx as per pre-operative assessment
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
## 2022-10-02 NOTE — Transfer of Care (Signed)
Immediate Anesthesia Transfer of Care Note  Patient: Christie Green  Procedure(s) Performed: DIAGNOSTIC LAPAROSCOPY WITH REMOVAL OF ECTOPIC PREGNANCY (Right) APPENDECTOMY LAPAROSCOPIC  Patient Location: PACU  Anesthesia Type:General  Level of Consciousness: awake, alert , and oriented  Airway & Oxygen Therapy: Patient Spontanous Breathing  Post-op Assessment: Report given to RN and Post -op Vital signs reviewed and stable  Post vital signs: Reviewed and stable  Last Vitals:  Vitals Value Taken Time  BP 129/68 10/02/22 1145  Temp    Pulse 65 10/02/22 1145  Resp 22 10/02/22 1145  SpO2 100 % 10/02/22 1145  Vitals shown include unfiled device data.  Last Pain:  Vitals:   10/02/22 0853  TempSrc: Oral  PainSc:          Complications: No notable events documented.

## 2022-10-02 NOTE — Op Note (Signed)
Preoperative diagnosis: Ectopic pregnancy Postoperative diagnosis: Ectopic pregnancy with inflammation of adjacent appendix  Procedure: laparoscopic appendectomy.  Anesthesia: GETA  Surgeon: Sung Amabile  Wound Classification: clean contaminated  Specimen: Appendix  Complications: None  Estimated Blood Loss: 10 mL for my portion of the exam.  Indications: Patient is a 27 y.o. female  presented with above.  IntraOp consultation requested by GYN provider when the appendix was noted to be densely adhered to the ectopic pregnancy tissue, with questionable serosal involvement.  Decision made at this point to proceed with appendectomy to ensure appendix does not cause further issues.    FIndings: 1.  Irritated appendix as above 2. No peri-appendiceal abscess or phlegmon 3. Normal anatomy 4. Appendiceal artery ligated and divided with ligasure 5. Adequate hemostasis.   Description of procedure:   I took over the procedure after Dr. Logan Bores portion of procedure was completed.  12 mm trocar placed under direct visualization at the site of former 11 mm port.  An irritated appendix was identified and elevated.  Mesoappendix ligated with ligature down to the appendiceal base.  Healthy base and cecum noted with adjacent ileum to the side.  A 45 mm blue load linear cutting stapler was then used to divide and staple the base of the appendix.  No bleeding from the staple lines or mesoappendix noted.  The appendix was placed in an endoscopic retrieval bag and removed.   The appendiceal stump and mesoappendix staple line examined again and hemostasis noted. The 12 mm trocar removed and port site closed with Efx Shield using 0 vicryl under direct vision. Remaining trocars were removed under direct vision. No bleeding was noted.The abdomen was allowed to collapse.  Local infused around port sites and all skin incisions then closed with subcuticular sutures Monocryl 4-0.  Wounds then dressed with  dermabond.  The patient tolerated the procedure well, awakened from anesthesia and was taken to the postanesthesia care unit in satisfactory condition.  Sponge count and instrument count correct at the end of the procedure.

## 2022-10-02 NOTE — ED Provider Notes (Signed)
[facility] Provider Note    Event Date/Time   First MD Initiated Contact with Patient 10/02/22 0033     (approximate)   History   Chief Complaint Abdominal Pain   HPI  Christie Green is a 27 y.o. female [phone] who presents to the ED complaining of abdominal pain.  Patient reports that she initially developed crampy pain in her lower abdomen 4 days ago, was seen in the ED at that time and diagnosed with likely miscarriage given downtrending hCG with ultrasound showing no gestational sac but ultrasound with possible retained products of conception.  This was discussed with OB/GYN and patient discharged home with follow-up, had been feeling better up until earlier this evening, when crampy pain returned.  She states she had some light bleeding yesterday but this has resolved and she has not had any discharge.  She denies any fevers, nausea, or vomiting.     Physical Exam   Triage Vital Signs: ED Triage Vitals  Encounter Vitals Group     BP 10/01/22 2333 117/67     Systolic BP Percentile --      Diastolic BP Percentile --      Pulse Rate 10/01/22 2333 76     Resp 10/01/22 2333 16     Temp 10/01/22 2333 98.1 F (36.7 C)     Temp Source 10/01/22 2333 Oral     SpO2 10/01/22 2333 100 %     Weight 10/01/22 2334 262 lb 5.6 oz (119 kg)     Height 10/01/22 2334 5\' 7"  (1.702 m)     Head Circumference --      Peak Flow --      Pain Score 10/01/22 2333 10     Pain Loc --      Pain Education --      Exclude from Growth Chart --     Most recent vital signs: Vitals:   10/02/22 0430 10/02/22 0445  BP:    Pulse: 61 70  Resp: 19 (!) 21  Temp:    SpO2: 100% 98%    Constitutional: Alert and oriented. Eyes: Conjunctivae are normal. Head: Atraumatic. Nose: No congestion/rhinnorhea. Mouth/Throat: Mucous membranes are moist.  Cardiovascular: Normal rate, regular rhythm. Grossly normal heart sounds.  2+ radial pulses bilaterally. Respiratory: Normal  respiratory effort.  No retractions. Lungs CTAB. Gastrointestinal: Soft and in the suprapubic area and right lower quadrant. No distention. Musculoskeletal: No lower extremity tenderness nor edema.  Neurologic:  Normal speech and language. No gross focal neurologic deficits are appreciated.    ED Results / Procedures / Treatments   Labs (all labs ordered are listed, but only abnormal results are displayed) Labs Reviewed  CBC - Abnormal; Notable for the following components:      Result Value   WBC 11.3 (*)    RBC 3.86 (*)    Hemoglobin 10.5 (*)    HCT 33.6 (*)    All other components within normal limits  HCG, QUANTITATIVE, PREGNANCY - Abnormal; Notable for the following components:   hCG, Beta Chain, Quant, S 29 (*)    All other components within normal limits  URINALYSIS, ROUTINE W REFLEX MICROSCOPIC - Abnormal; Notable for the following components:   Color, Urine AMBER (*)    APPearance CLOUDY (*)    Hgb urine dipstick MODERATE (*)    Protein, ur 30 (*)    Nitrite POSITIVE (*)    Bacteria, UA RARE (*)    All other components within normal limits  LIPASE, BLOOD  COMPREHENSIVE METABOLIC PANEL   RADIOLOGY Obstetric ultrasound reviewed and interpreted by me with no intrauterine pregnancy, cystic structure noted in the right adnexa.  PROCEDURES:  Critical Care performed: No  Procedures   MEDICATIONS ORDERED IN ED: Medications  morphine (PF) 4 MG/ML injection 4 mg (has no administration in time range)  morphine (PF) 4 MG/ML injection 4 mg (4 mg Intravenous Given 10/02/22 0225)  ondansetron (ZOFRAN) injection 4 mg (4 mg Intravenous Given 10/02/22 0225)  lactated ringers bolus 1,000 mL (1,000 mLs Intravenous New Bag/Given 10/02/22 0225)     IMPRESSION / MDM / ASSESSMENT AND PLAN / ED COURSE  I reviewed the triage vital signs and the nursing notes.                              27 y.o. female [phone] with past medical history of ovarian cyst, anemia, GERD, and kidney  stones who presents to the ED with increasing pain in her abdomen following miscarriage over the weekend.  Patient's presentation is most consistent with acute presentation with potential threat to life or bodily function.  Differential diagnosis includes, but is not limited to, ectopic pregnancy, retained products of conception, miscarriage, anemia.  Patient nontoxic-appearing and in no acute distress, vital signs are unremarkable.  Her abdomen is soft but she does have tenderness in her suprapubic area as well as her right lower quadrant.  Her recent ultrasound was concerning for retained products of conception and we will repeat her ultrasound tonight as well as beta hCG levels.  Labs show slightly uptrending WBC, no significant anemia, electrolyte abnormality, or AKI noted.  LFTs and lipase are also unremarkable.  We will treat with IV morphine and Zofran, hydrate with IV fluids and reassess.  Beta-hCG downtrending at 29, pelvic ultrasound shows no intrauterine pregnancy or retained products, does show cystic structure in the right adnexa.  Per radiology, this is concerning for possible ectopic pregnancy.  Case discussed with Dr. Logan Bores of OB/GYN, who states it seems unlikely patient developed 7 cm ectopic pregnancy in the 4 days following her most recent ultrasound.  He recommends CT imaging to further characterize and he will follow-up imaging results.  Patient to remain n.p.o. for now in case surgical intervention required.  Patient turned over to oncoming rider pending CT results and reassessment.      FINAL CLINICAL IMPRESSION(S) / ED DIAGNOSES   Final diagnoses:  Right lower quadrant abdominal pain     Rx / DC Orders   ED Discharge Orders     None        Note:  This document was prepared using Dragon voice recognition software and may include unintentional dictation errors.   Chesley Noon, MD 10/02/22 [phone]

## 2022-10-03 ENCOUNTER — Encounter: Payer: Self-pay | Admitting: Obstetrics and Gynecology

## 2022-10-04 NOTE — Anesthesia Postprocedure Evaluation (Signed)
Anesthesia Post Note  Patient: Christie Green  Procedure(s) Performed: DIAGNOSTIC LAPAROSCOPY WITH REMOVAL OF ECTOPIC PREGNANCY (Right) APPENDECTOMY LAPAROSCOPIC  Patient location during evaluation: PACU Anesthesia Type: General Level of consciousness: awake and alert Pain management: pain level controlled Vital Signs Assessment: post-procedure vital signs reviewed and stable Respiratory status: spontaneous breathing, nonlabored ventilation, respiratory function stable and patient connected to nasal cannula oxygen Cardiovascular status: blood pressure returned to baseline and stable Postop Assessment: no apparent nausea or vomiting Anesthetic complications: no   No notable events documented.   Last Vitals:  Vitals:   10/02/22 1307 10/02/22 1508  BP: 121/74 (!) 128/54  Pulse: 68 78  Resp: 18 18  Temp: (!) 36.1 C (!) 36.3 C  SpO2: 94% 98%    Last Pain:  Vitals:   10/02/22 1508  TempSrc: Temporal  PainSc: 0-No pain                 Lenard Simmer

## 2022-10-07 ENCOUNTER — Telehealth: Payer: Self-pay | Admitting: Obstetrics & Gynecology

## 2022-10-07 ENCOUNTER — Other Ambulatory Visit: Payer: Medicaid Other

## 2022-10-07 NOTE — Telephone Encounter (Signed)
I reached out to pt to reschedule lab appt (Beta) that was scheduled on 10/07/2022.  Pt states that she had surgery on 10/01/2022 to remove her tube bc of an ectopic pregnancy.  Pt was wondering if she needed to have the lab work done since she had the surgery.  Please advise.

## 2022-10-08 LAB — SURGICAL PATHOLOGY

## 2022-10-08 NOTE — Telephone Encounter (Signed)
Patient called to clarify location of appointment (office or hospital). Verified Office ([facility])

## 2022-10-08 NOTE — Telephone Encounter (Signed)
The pt did not need to reschedule the lab appt, because she had surgery on 10/01/2022 to remove the tube.  I spoke with Dr. Ellin Saba CMA Shanda Bumps).Marland Kitchen

## 2022-10-09 ENCOUNTER — Encounter: Payer: Self-pay | Admitting: Obstetrics and Gynecology

## 2022-10-09 ENCOUNTER — Ambulatory Visit (INDEPENDENT_AMBULATORY_CARE_PROVIDER_SITE_OTHER): Payer: Medicaid Other | Admitting: Obstetrics and Gynecology

## 2022-10-09 VITALS — BP 107/71 | HR 85 | Ht 67.0 in | Wt 261.1 lb

## 2022-10-09 DIAGNOSIS — Z9889 Other specified postprocedural states: Secondary | ICD-10-CM

## 2022-10-09 DIAGNOSIS — Z4889 Encounter for other specified surgical aftercare: Secondary | ICD-10-CM

## 2022-10-09 DIAGNOSIS — O00101 Right tubal pregnancy without intrauterine pregnancy: Secondary | ICD-10-CM

## 2022-10-09 NOTE — Progress Notes (Signed)
Patient presents for 1 week postop follow-up following right salpingectomy due to an ectopic pregnancy. She states still taking pain medication. She reports this event scared her and she would like to start depo shots for birth control when able.
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
## 2022-10-16 ENCOUNTER — Ambulatory Visit: Payer: Self-pay

## 2022-11-06 ENCOUNTER — Encounter: Payer: Medicaid Other | Admitting: Obstetrics and Gynecology

## 2022-11-06 DIAGNOSIS — Z9889 Other specified postprocedural states: Secondary | ICD-10-CM

## 2022-11-06 DIAGNOSIS — O00101 Right tubal pregnancy without intrauterine pregnancy: Secondary | ICD-10-CM

## 2022-11-07 ENCOUNTER — Encounter: Payer: Medicaid Other | Admitting: Obstetrics and Gynecology

## 2022-11-07 ENCOUNTER — Telehealth: Payer: Self-pay | Admitting: Obstetrics and Gynecology

## 2022-11-07 NOTE — Telephone Encounter (Signed)
Reached out to pt to reschedule post op appt that was scheduled on 11/07/2022 at 8:55 with Dr. Logan Bores.  Left message for pt to call back.

## 2022-11-08 NOTE — Telephone Encounter (Signed)
Reached out to pt (2x) to reschedule post op appt that was scheduled on 11/07/2022 at 8:55 with Dr. Logan Bores.  Was able to reschedule appt to 11/13 with Dr. Logan Bores.

## 2022-11-20 ENCOUNTER — Ambulatory Visit (INDEPENDENT_AMBULATORY_CARE_PROVIDER_SITE_OTHER): Payer: Medicaid Other | Admitting: Obstetrics and Gynecology

## 2022-11-20 ENCOUNTER — Encounter: Payer: Self-pay | Admitting: Obstetrics and Gynecology

## 2022-11-20 VITALS — BP 89/67 | HR 64 | Ht 67.0 in | Wt 260.5 lb

## 2022-11-20 DIAGNOSIS — O00101 Right tubal pregnancy without intrauterine pregnancy: Secondary | ICD-10-CM

## 2022-11-20 DIAGNOSIS — Z3042 Encounter for surveillance of injectable contraceptive: Secondary | ICD-10-CM

## 2022-11-20 DIAGNOSIS — Z3202 Encounter for pregnancy test, result negative: Secondary | ICD-10-CM

## 2022-11-20 DIAGNOSIS — Z9889 Other specified postprocedural states: Secondary | ICD-10-CM

## 2022-11-20 DIAGNOSIS — Z4889 Encounter for other specified surgical aftercare: Secondary | ICD-10-CM

## 2022-11-20 LAB — POCT URINE PREGNANCY: Preg Test, Ur: NEGATIVE

## 2022-11-20 MED ORDER — MEDROXYPROGESTERONE ACETATE 150 MG/ML IM SUSP
150.0000 mg | Freq: Once | INTRAMUSCULAR | Status: AC
Start: 2022-11-20 — End: 2022-11-20
  Administered 2022-11-20: 150 mg via INTRAMUSCULAR
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
## 2022-11-20 NOTE — Addendum Note (Signed)
Addended by: Georgiana Shore R on: 11/20/2022 11:14 AM   Modules accepted: Orders

## 2022-11-20 NOTE — Progress Notes (Signed)
Patient presents for 6 week postop follow-up following ectopic pregnancy. She states no pain or abnormal bleeding at this time. Patient wishes to start birth control, prefers depo.

## 2022-11-20 NOTE — Progress Notes (Signed)
    NURSE VISIT NOTE  Subjective:    Patient ID: Christie Green, female    DOB: 1995-02-20, 27 y.o.   MRN: [id number]  HPI  Patient is a 27 y.o. [phone] female who presents for depo provera injection.   Objective:    BP (!) 89/67   Pulse 64   Ht 5\' 7"  (1.702 m)   Wt 260 lb 8 oz (118.2 kg)   LMP 11/08/2022   Breastfeeding No   BMI 40.80 kg/m   Last Annual: 02/06/22 Last pap: 02/06/22. Last Depo-Provera: n/a. Side Effects if any: n/a. Serum HCG indicated? Yes . Depo-Provera 150 mg IM given by: Georgiana Shore, CMA. Site: Right Deltoid   Assessment:   1. Postoperative state   2. Right tubal pregnancy without intrauterine pregnancy   3. Encounter for management and injection of depo-Provera      Plan:   Next appointment due between 02/05/23 and 02/19/23.    Loman Chroman, CMA

## 2022-12-11 ENCOUNTER — Other Ambulatory Visit: Payer: Self-pay

## 2022-12-11 ENCOUNTER — Telehealth: Payer: Self-pay

## 2022-12-11 ENCOUNTER — Ambulatory Visit (INDEPENDENT_AMBULATORY_CARE_PROVIDER_SITE_OTHER): Payer: Medicaid Other

## 2022-12-11 VITALS — BP 108/64 | HR 61 | Ht 67.0 in | Wt 261.0 lb

## 2022-12-11 DIAGNOSIS — B372 Candidiasis of skin and nail: Secondary | ICD-10-CM

## 2022-12-11 DIAGNOSIS — N6452 Nipple discharge: Secondary | ICD-10-CM

## 2022-12-11 MED ORDER — NYSTATIN-TRIAMCINOLONE 100000-0.1 UNIT/GM-% EX CREA
1.0000 | TOPICAL_CREAM | Freq: Two times a day (BID) | CUTANEOUS | 2 refills | Status: DC
Start: 2022-12-11 — End: 2023-11-13

## 2022-12-11 NOTE — Telephone Encounter (Signed)
TRIAGE VOICEMAIL: Patient states she is having a yellow creamy discharge from her right nipple. Inquiring about appointment or advice.

## 2022-12-11 NOTE — Telephone Encounter (Addendum)
Patient following up on previous message. She states she noticed it last night when she got out of the shower. She states it hurts when she touches it. Her baby is almost two. She is not breastfeeding. She states she has a rash under both breast. She had a tubal pregnancy in September and had surgery. Advised needs evaluation. Appointment scheduled for today at 1:35pm
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
## 2022-12-12 ENCOUNTER — Other Ambulatory Visit: Payer: Self-pay

## 2022-12-12 DIAGNOSIS — B372 Candidiasis of skin and nail: Secondary | ICD-10-CM

## 2022-12-12 LAB — PROLACTIN: Prolactin: 7.8 ng/mL (ref 4.8–33.4)

## 2022-12-12 LAB — TSH: TSH: 1.13 u[IU]/mL (ref 0.450–4.500)

## 2022-12-12 MED ORDER — TRIAMCINOLONE ACETONIDE 0.025 % EX OINT
TOPICAL_OINTMENT | CUTANEOUS | 0 refills | Status: DC
Start: 2022-12-12 — End: 2023-11-13

## 2022-12-12 MED ORDER — NYSTATIN 100000 UNIT/GM EX CREA
TOPICAL_CREAM | CUTANEOUS | 1 refills | Status: DC
Start: 2022-12-12 — End: 2023-11-13

## 2022-12-12 NOTE — Telephone Encounter (Signed)
Alternative sent in

## 2023-01-05 ENCOUNTER — Other Ambulatory Visit: Payer: Self-pay

## 2023-01-05 ENCOUNTER — Emergency Department
Admission: EM | Admit: 2023-01-05 | Discharge: 2023-01-05 | Disposition: A | Discharge status: Home / Self Care | Payer: Medicaid Other | Attending: Emergency Medicine | Admitting: Emergency Medicine

## 2023-01-05 ENCOUNTER — Emergency Department: Payer: Medicaid Other

## 2023-01-05 DIAGNOSIS — R1030 Lower abdominal pain, unspecified: Secondary | ICD-10-CM | POA: Diagnosis present

## 2023-01-05 DIAGNOSIS — N3000 Acute cystitis without hematuria: Secondary | ICD-10-CM

## 2023-01-05 LAB — URINALYSIS, ROUTINE W REFLEX MICROSCOPIC
Bilirubin Urine: NEGATIVE
Glucose, UA: NEGATIVE mg/dL
Ketones, ur: NEGATIVE mg/dL
Nitrite: POSITIVE — AB
Protein, ur: NEGATIVE mg/dL
RBC / HPF: >50 RBC/hpf (ref 0–5)
Specific Gravity, Urine: 1.029 (ref 1.005–1.030)
pH: 5 (ref 5.0–8.0)

## 2023-01-05 LAB — CBC
HCT: 34.2 % — ABNORMAL LOW (ref 36.0–46.0)
Hemoglobin: 10.8 g/dL — ABNORMAL LOW (ref 12.0–15.0)
MCH: 25.4 pg — ABNORMAL LOW (ref 26.0–34.0)
MCHC: 31.6 g/dL (ref 30.0–36.0)
MCV: 80.5 fL (ref 80.0–100.0)
Platelets: 279 10*3/uL (ref 150–400)
RBC: 4.25 MIL/uL (ref 3.87–5.11)
RDW: 18.3 % — ABNORMAL HIGH (ref 11.5–15.5)
WBC: 10.7 10*3/uL — ABNORMAL HIGH (ref 4.0–10.5)
nRBC: 0 % (ref 0.0–0.2)

## 2023-01-05 LAB — POC URINE PREG, ED: Preg Test, Ur: NEGATIVE

## 2023-01-05 LAB — COMPREHENSIVE METABOLIC PANEL
ALT: 27 U/L (ref 0–44)
AST: 20 U/L (ref 15–41)
Albumin: 3.7 g/dL (ref 3.5–5.0)
Alkaline Phosphatase: 115 U/L (ref 38–126)
Anion gap: 9 (ref 5–15)
BUN: 18 mg/dL (ref 6–20)
CO2: 22 mmol/L (ref 22–32)
Calcium: 9 mg/dL (ref 8.9–10.3)
Chloride: 105 mmol/L (ref 98–111)
Creatinine, Ser: 0.83 mg/dL (ref 0.44–1.00)
GFR, Estimated: >60 mL/min (ref >60)
Glucose, Bld: 100 mg/dL — ABNORMAL HIGH (ref 70–99)
Potassium: 4 mmol/L (ref 3.5–5.1)
Sodium: 136 mmol/L (ref 135–145)
Total Bilirubin: 0.5 mg/dL (ref <1.2)
Total Protein: 7.9 g/dL (ref 6.5–8.1)

## 2023-01-05 LAB — LIPASE, BLOOD: Lipase: 42 U/L (ref 11–51)

## 2023-01-05 MED ORDER — IOHEXOL 350 MG/ML SOLN
100.0000 mL | Freq: Once | INTRAVENOUS | Status: AC | PRN
  Administered 2023-01-05: 100 mL via INTRAVENOUS

## 2023-01-05 MED ORDER — CEPHALEXIN 500 MG PO CAPS: 500.0000 mg | ORAL_CAPSULE | Freq: Three times a day (TID) | ORAL | 0 refills | Status: AC

## 2023-01-05 MED ORDER — CEPHALEXIN 500 MG PO CAPS
500.0000 mg | ORAL_CAPSULE | Freq: Once | ORAL | Status: AC
  Administered 2023-01-05: 500 mg via ORAL
  Filled 2023-01-05: qty 1

## 2023-01-05 NOTE — ED Provider Notes (Signed)
[facility] Provider Note    Event Date/Time   First MD Initiated Contact with Patient 01/05/23 0515     (approximate)   History   Abdominal Pain   HPI  Christie Green is a 27 y.o. female who presents to the ED for evaluation of Abdominal Pain   I review OB/GYN clinic visit from 1 month ago.  On 9/25 she had a laparoscopy for ectopic pregnancy.  I reviewed operative notes where right adnexa was adhered to her appendix and she also had an appendectomy with general surgery involved.  Patient resents for evaluation of suprapubic abdominal discomfort over the past few hours.  No other symptoms such as emesis, dysuria, fever.  Does report she has had some vaginal spotting without other discharge.  She is on Depo   Physical Exam   Triage Vital Signs: ED Triage Vitals  Encounter Vitals Group     BP 01/05/23 0018 113/61     Systolic BP Percentile --      Diastolic BP Percentile --      Pulse Rate 01/05/23 0018 86     Resp 01/05/23 0018 18     Temp 01/05/23 0018 99.6 F (37.6 C)     Temp Source 01/05/23 0018 Oral     SpO2 01/05/23 0018 100 %     Weight 01/05/23 0018 240 lb (108.9 kg)     Height 01/05/23 0018 5\' 7"  (1.702 m)     Head Circumference --      Peak Flow --      Pain Score 01/05/23 0028 7     Pain Loc --      Pain Education --      Exclude from Growth Chart --     Most recent vital signs: Vitals:   01/05/23 0018  BP: 113/61  Pulse: 86  Resp: 18  Temp: 99.6 F (37.6 C)  SpO2: 100%    General: Awake, no distress.  CV:  Good peripheral perfusion.  Resp:  Normal effort.  Abd:  No distention.  Mild suprapubic tenderness without peritoneal features.  Remainder of the abdomen is benign. MSK:  No deformity noted.  Neuro:  No focal deficits appreciated. Other:     ED Results / Procedures / Treatments   Labs (all labs ordered are listed, but only abnormal results are displayed) Labs Reviewed  COMPREHENSIVE METABOLIC PANEL -  Abnormal; Notable for the following components:      Result Value   Glucose, Bld 100 (*)    All other components within normal limits  CBC - Abnormal; Notable for the following components:   WBC 10.7 (*)    Hemoglobin 10.8 (*)    HCT 34.2 (*)    MCH 25.4 (*)    RDW 18.3 (*)    All other components within normal limits  URINALYSIS, ROUTINE W REFLEX MICROSCOPIC - Abnormal; Notable for the following components:   Color, Urine YELLOW (*)    APPearance HAZY (*)    Hgb urine dipstick LARGE (*)    Nitrite POSITIVE (*)    Leukocytes,Ua TRACE (*)    Bacteria, UA MANY (*)    All other components within normal limits  LIPASE, BLOOD  POC URINE PREG, ED    EKG   RADIOLOGY CT abdomen/pelvis interpreted by me without evidence of acute pathology  Official radiology report(s): No results found.  PROCEDURES and INTERVENTIONS:  Procedures  Medications - No data to display   IMPRESSION / MDM / ASSESSMENT  AND PLAN / ED COURSE  I reviewed the triage vital signs and the nursing notes.  Differential diagnosis includes, but is not limited to, postop abscess, PID, cystitis, pyelonephritis, SBO  {Patient presents with symptoms of an acute illness or injury that is potentially life-threatening.  Patient presents with suprapubic discomfort with evidence of acute cystitis suitable for outpatient management with antibiotics.  Normal vital signs.  Blood work with marginal leukocytosis.  No metabolic derangements.  Normal lipase.  Urine with infectious features and we will send this for culture.  CT obtained without evidence of acute pathology.  Suspect her spotting is more related to her Depo-Provera.  We discussed the possibility of infection such as STI or PID.  She does not think this is the case.   We discussed antibiotics for cystitis and ED return precautions.  Discharged with Keflex.  Urine culture sent  Clinical Course as of 01/05/23 0654  Sun Jan 05, 2023  0454 Reassessed.  Discussed  CT results, suspicion for cystitis, recommendation for antibiotics.  She is agreeable. [DS]    Clinical Course User Index [DS] Delton Prairie, MD     FINAL CLINICAL IMPRESSION(S) / ED DIAGNOSES   Final diagnoses:  None     Rx / DC Orders   ED Discharge Orders     None        Note:  This document was prepared using Dragon voice recognition software and may include unintentional dictation errors.   Delton Prairie, MD 01/05/23 [phone]

## 2023-01-05 NOTE — ED Triage Notes (Signed)
Pt reports abd pain around surgical incisions. Reports had R ectopic pregnancy in September that required sx and had appendectomy at that time as well. Pt reports pain onset earlier this evening. Denies n/v/d or fevers at home. Ambulatory to triage. Alert and oriented. Breathing unlabored speaking in full sentences with symmetric chest rise and fall.

## 2023-01-07 LAB — URINE CULTURE: Culture: >=100000 — AB

## 2023-02-04 ENCOUNTER — Emergency Department: Payer: Medicaid Other

## 2023-02-04 ENCOUNTER — Other Ambulatory Visit: Payer: Self-pay

## 2023-02-04 DIAGNOSIS — R0789 Other chest pain: Secondary | ICD-10-CM | POA: Diagnosis present

## 2023-02-04 DIAGNOSIS — N3090 Cystitis, unspecified without hematuria: Secondary | ICD-10-CM | POA: Diagnosis not present

## 2023-02-04 LAB — CBC
HCT: 34.4 % — ABNORMAL LOW (ref 36.0–46.0)
Hemoglobin: 10.5 g/dL — ABNORMAL LOW (ref 12.0–15.0)
MCH: 25.5 pg — ABNORMAL LOW (ref 26.0–34.0)
MCHC: 30.5 g/dL (ref 30.0–36.0)
MCV: 83.7 fL (ref 80.0–100.0)
Platelets: 252 10*3/uL (ref 150–400)
RBC: 4.11 MIL/uL (ref 3.87–5.11)
RDW: 19.3 % — ABNORMAL HIGH (ref 11.5–15.5)
WBC: 8.9 10*3/uL (ref 4.0–10.5)
nRBC: 0 % (ref 0.0–0.2)

## 2023-02-04 LAB — BASIC METABOLIC PANEL
Anion gap: 14 (ref 5–15)
BUN: 15 mg/dL (ref 6–20)
CO2: 22 mmol/L (ref 22–32)
Calcium: 9 mg/dL (ref 8.9–10.3)
Chloride: 104 mmol/L (ref 98–111)
Creatinine, Ser: 0.91 mg/dL (ref 0.44–1.00)
GFR, Estimated: >60 mL/min (ref >60)
Glucose, Bld: 109 mg/dL — ABNORMAL HIGH (ref 70–99)
Potassium: 4.1 mmol/L (ref 3.5–5.1)
Sodium: 140 mmol/L (ref 135–145)

## 2023-02-04 LAB — TROPONIN I (HIGH SENSITIVITY): Troponin I (High Sensitivity): <2 ng/L (ref <18)
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
## 2023-02-05 ENCOUNTER — Emergency Department
Admission: EM | Admit: 2023-02-05 | Discharge: 2023-02-05 | Disposition: A | Discharge status: Home / Self Care | Payer: Medicaid Other | Attending: Emergency Medicine | Admitting: Emergency Medicine

## 2023-02-05 DIAGNOSIS — N3090 Cystitis, unspecified without hematuria: Secondary | ICD-10-CM

## 2023-02-05 LAB — URINALYSIS, W/ REFLEX TO CULTURE (INFECTION SUSPECTED)
Bilirubin Urine: NEGATIVE
Glucose, UA: NEGATIVE mg/dL
Hgb urine dipstick: NEGATIVE
Ketones, ur: NEGATIVE mg/dL
Leukocytes,Ua: NEGATIVE
Nitrite: POSITIVE — AB
Protein, ur: NEGATIVE mg/dL
Specific Gravity, Urine: 1.028 (ref 1.005–1.030)
pH: 5 (ref 5.0–8.0)

## 2023-02-05 LAB — TROPONIN I (HIGH SENSITIVITY): Troponin I (High Sensitivity): <2 ng/L (ref <18)

## 2023-02-05 LAB — POC URINE PREG, ED: Preg Test, Ur: NEGATIVE

## 2023-02-05 MED ORDER — CEPHALEXIN 500 MG PO CAPS: 500.0000 mg | ORAL_CAPSULE | Freq: Two times a day (BID) | ORAL | 0 refills | Status: AC
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
## 2023-02-12 ENCOUNTER — Ambulatory Visit: Payer: Medicaid Other

## 2023-02-12 NOTE — Progress Notes (Deleted)
    NURSE VISIT NOTE  Subjective:    Patient ID: Kharis Lapenna, female    DOB: November 06, 1995, 28 y.o.   MRN: [id number]  HPI  Patient is a 28 y.o. [phone] female who presents for depo provera  injection.   Objective:    LMP 01/05/2023 (Approximate)   Last Annual: ***. Last pap: 02/06/22. Last Depo-Provera : 11/20/22. Side Effects if any: {NONE:21772}***. Serum HCG indicated? {YES/NO:21197}. Depo-Provera  150 mg IM given by: {AOB Clinical Dujqq:71459}. Site: {AOB INJ [id number]  Lab Review  @THIS  VISIT ONLY@  Assessment:   No diagnosis found.   Plan:   Next appointment due between *** and ***.    Reace Breshears H Khalia Gong, CMA

## 2023-04-03 ENCOUNTER — Ambulatory Visit

## 2023-04-08 ENCOUNTER — Ambulatory Visit (INDEPENDENT_AMBULATORY_CARE_PROVIDER_SITE_OTHER)

## 2023-04-08 VITALS — BP 108/67 | HR 92 | Ht 66.0 in | Wt 257.0 lb

## 2023-04-08 DIAGNOSIS — Z3202 Encounter for pregnancy test, result negative: Secondary | ICD-10-CM

## 2023-04-08 DIAGNOSIS — N912 Amenorrhea, unspecified: Secondary | ICD-10-CM | POA: Diagnosis not present

## 2023-04-08 DIAGNOSIS — Z3200 Encounter for pregnancy test, result unknown: Secondary | ICD-10-CM

## 2023-04-08 LAB — POCT URINE PREGNANCY: Preg Test, Ur: NEGATIVE

## 2023-04-08 NOTE — Progress Notes (Addendum)
    NURSE VISIT NOTE  Subjective:    Patient ID: Keta Vanvalkenburgh, female    DOB: 1995-06-12, 28 y.o.   MRN: [id number]  HPI  Patient is a 28 y.o. [phone] female who presents for evaluation of amenorrhea. She believes she could be pregnant. Pregnancy is desired. Current symptoms also include: breast tenderness, fatigue, morning sickness, nausea, and positive home pregnancy test. Last period was abnormal .    Objective:    BP 108/67   Pulse 92   Ht 5\' 6"  (1.676 m)   Wt 257 lb (116.6 kg)   LMP 02/25/2023   BMI 41.48 kg/m   Lab Review  Results for orders placed or performed in visit on 04/08/23  POCT urine pregnancy  Result Value Ref Range   Preg Test, Ur Negative Negative    Assessment:   1. Possible pregnancy, not confirmed     Plan:  Pregnancy test negative in office will send for a beta.     Loney Laurence, CMA

## 2023-04-09 LAB — BETA HCG QUANT (REF LAB): hCG Quant: <1 m[IU]/mL

## 2023-04-10 ENCOUNTER — Other Ambulatory Visit

## 2023-05-14 ENCOUNTER — Ambulatory Visit

## 2023-05-20 ENCOUNTER — Emergency Department
Admission: EM | Admit: 2023-05-20 | Discharge: 2023-05-21 | Disposition: A | Discharge status: Home / Self Care | Attending: Emergency Medicine | Admitting: Emergency Medicine

## 2023-05-20 ENCOUNTER — Other Ambulatory Visit: Payer: Self-pay

## 2023-05-20 DIAGNOSIS — D649 Anemia, unspecified: Secondary | ICD-10-CM | POA: Diagnosis not present

## 2023-05-20 DIAGNOSIS — N939 Abnormal uterine and vaginal bleeding, unspecified: Secondary | ICD-10-CM | POA: Insufficient documentation

## 2023-05-20 DIAGNOSIS — F17200 Nicotine dependence, unspecified, uncomplicated: Secondary | ICD-10-CM | POA: Diagnosis not present

## 2023-05-20 LAB — CBC
HCT: 33.6 % — ABNORMAL LOW (ref 36.0–46.0)
Hemoglobin: 10.6 g/dL — ABNORMAL LOW (ref 12.0–15.0)
MCH: 27.2 pg (ref 26.0–34.0)
MCHC: 31.5 g/dL (ref 30.0–36.0)
MCV: 86.4 fL (ref 80.0–100.0)
Platelets: 255 10*3/uL (ref 150–400)
RBC: 3.89 MIL/uL (ref 3.87–5.11)
RDW: 16 % — ABNORMAL HIGH (ref 11.5–15.5)
WBC: 8 10*3/uL (ref 4.0–10.5)
nRBC: 0 % (ref 0.0–0.2)
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
## 2023-05-20 NOTE — ED Triage Notes (Addendum)
 Pt to ED via POV c/o vag bleeding/ possible miscarriage. Pt reports positive preg test about 2 months ago. Has not seen OB yet. Pt reports she thinks had miscarriage anbout 30 mins ago, has products of conception in bag. Pt still having some heavy bleeding, has gone through one tampon. Denies any pain.

## 2023-05-21 ENCOUNTER — Emergency Department

## 2023-05-21 LAB — COMPREHENSIVE METABOLIC PANEL WITH GFR
ALT: 19 U/L (ref 0–44)
AST: 23 U/L (ref 15–41)
Albumin: 3.8 g/dL (ref 3.5–5.0)
Alkaline Phosphatase: 114 U/L (ref 38–126)
Anion gap: 6 (ref 5–15)
BUN: 10 mg/dL (ref 6–20)
CO2: 23 mmol/L (ref 22–32)
Calcium: 8.7 mg/dL — ABNORMAL LOW (ref 8.9–10.3)
Chloride: 107 mmol/L (ref 98–111)
Creatinine, Ser: 0.71 mg/dL (ref 0.44–1.00)
GFR, Estimated: >60 mL/min (ref >60)
Glucose, Bld: 119 mg/dL — ABNORMAL HIGH (ref 70–99)
Potassium: 3.6 mmol/L (ref 3.5–5.1)
Sodium: 136 mmol/L (ref 135–145)
Total Bilirubin: 0.5 mg/dL (ref 0.0–1.2)
Total Protein: 7.7 g/dL (ref 6.5–8.1)

## 2023-05-21 LAB — HCG, QUANTITATIVE, PREGNANCY: hCG, Beta Chain, Quant, S: <1 m[IU]/mL (ref <5)

## 2023-05-21 NOTE — Discharge Instructions (Signed)
 Return to the ER for worsening symptoms, soaking more than 1 pad per hour, persistent vomiting or other concerns.

## 2023-05-21 NOTE — ED Notes (Signed)
 Pt cleared and discharged. All discharge teachings given to pt. Pt verbalizes back understanding of teachings. Departed with steady gait

## 2023-05-22 ENCOUNTER — Ambulatory Visit (INDEPENDENT_AMBULATORY_CARE_PROVIDER_SITE_OTHER)

## 2023-05-22 VITALS — BP 118/70 | HR 62 | Ht 66.0 in | Wt 254.9 lb

## 2023-05-22 DIAGNOSIS — Z3042 Encounter for surveillance of injectable contraceptive: Secondary | ICD-10-CM

## 2023-05-22 MED ORDER — MEDROXYPROGESTERONE ACETATE 150 MG/ML IM SUSP
150.0000 mg | Freq: Once | INTRAMUSCULAR | Status: AC
  Administered 2023-05-22: 150 mg via INTRAMUSCULAR

## 2023-05-22 NOTE — Patient Instructions (Signed)

## 2023-05-22 NOTE — Progress Notes (Signed)
    NURSE VISIT NOTE  Subjective:    Patient ID: Christie Green, female    DOB: October 24, 1995, 28 y.o.   MRN: [id number]  HPI  Patient is a 28 y.o. [phone] female who presents for depo provera  injection.   Objective:    BP 118/70   Pulse 62   Ht 5\' 6"  (1.676 m)   Wt 254 lb 14.4 oz (115.6 kg)   LMP 05/21/2023 (Approximate) Comment: patient reports started spotting  BMI 41.14 kg/m   Last Annual: 02/06/2022. Last pap: 02/06/2022. Last Depo-Provera : 11/20/2022. Side Effects if any: none. Serum HCG indicated? Yes .Done on 05/20/23- Negative Depo-Provera  150 mg IM given by: Venetta Gill, CMA. Site: Right Deltoid  Lab Review  .          Component Ref Range & Units (hover) 2 d ago (05/20/23) 7 mo ago (10/01/22) 7 mo ago (09/28/22) 8 mo ago (09/05/22) 8 mo ago (09/02/22) 5 yr ago (12/03/17) 5 yr ago (11/05/17)  hCG, Beta Chain, Quant, S <1 29 High  CM 34 High  CM 763 High  CM 801 High  CM 60,441 High  CM    Assessment:   1. Encounter for management and injection of depo-Provera       Plan:   Next appointment due between 08/07/2023 and 08/21/2023.    Inga Manges, CMA

## 2023-07-15 ENCOUNTER — Emergency Department

## 2023-07-15 ENCOUNTER — Emergency Department
Admission: EM | Admit: 2023-07-15 | Discharge: 2023-07-15 | Disposition: A | Discharge status: Home / Self Care | Attending: Emergency Medicine | Admitting: Emergency Medicine

## 2023-07-15 ENCOUNTER — Other Ambulatory Visit: Payer: Self-pay

## 2023-07-15 DIAGNOSIS — R109 Unspecified abdominal pain: Secondary | ICD-10-CM | POA: Diagnosis not present

## 2023-07-15 DIAGNOSIS — R112 Nausea with vomiting, unspecified: Secondary | ICD-10-CM | POA: Insufficient documentation

## 2023-07-15 DIAGNOSIS — F1090 Alcohol use, unspecified, uncomplicated: Secondary | ICD-10-CM | POA: Diagnosis not present

## 2023-07-15 DIAGNOSIS — R197 Diarrhea, unspecified: Secondary | ICD-10-CM | POA: Insufficient documentation

## 2023-07-15 LAB — CBC
HCT: 33.2 % — ABNORMAL LOW (ref 36.0–46.0)
Hemoglobin: 10.5 g/dL — ABNORMAL LOW (ref 12.0–15.0)
MCH: 27.3 pg (ref 26.0–34.0)
MCHC: 31.6 g/dL (ref 30.0–36.0)
MCV: 86.5 fL (ref 80.0–100.0)
Platelets: 232 K/uL (ref 150–400)
RBC: 3.84 MIL/uL — ABNORMAL LOW (ref 3.87–5.11)
RDW: 17 % — ABNORMAL HIGH (ref 11.5–15.5)
WBC: 9.3 K/uL (ref 4.0–10.5)
nRBC: 0 % (ref 0.0–0.2)

## 2023-07-15 LAB — COMPREHENSIVE METABOLIC PANEL WITH GFR
ALT: 23 U/L (ref 0–44)
AST: 20 U/L (ref 15–41)
Albumin: 3.5 g/dL (ref 3.5–5.0)
Alkaline Phosphatase: 101 U/L (ref 38–126)
Anion gap: 10 (ref 5–15)
BUN: 9 mg/dL (ref 6–20)
CO2: 23 mmol/L (ref 22–32)
Calcium: 9.1 mg/dL (ref 8.9–10.3)
Chloride: 106 mmol/L (ref 98–111)
Creatinine, Ser: 0.71 mg/dL (ref 0.44–1.00)
GFR, Estimated: >60 mL/min (ref >60)
Glucose, Bld: 132 mg/dL — ABNORMAL HIGH (ref 70–99)
Potassium: 3.5 mmol/L (ref 3.5–5.1)
Sodium: 139 mmol/L (ref 135–145)
Total Bilirubin: 0.3 mg/dL (ref 0.0–1.2)
Total Protein: 7.3 g/dL (ref 6.5–8.1)

## 2023-07-15 LAB — LIPASE, BLOOD: Lipase: 45 U/L (ref 11–51)

## 2023-07-15 LAB — TROPONIN I (HIGH SENSITIVITY): Troponin I (High Sensitivity): <2 ng/L (ref <18)

## 2023-07-15 LAB — PREGNANCY, URINE: Preg Test, Ur: NEGATIVE

## 2023-07-15 MED ORDER — ONDANSETRON 4 MG PO TBDP: 4.0000 mg | ORAL_TABLET | Freq: Three times a day (TID) | ORAL | 0 refills | Status: DC | PRN

## 2023-07-15 MED ORDER — ONDANSETRON 4 MG PO TBDP
4.0000 mg | ORAL_TABLET | Freq: Once | ORAL | Status: AC
  Administered 2023-07-15: 4 mg via ORAL
  Filled 2023-07-15: qty 1

## 2023-07-15 MED ORDER — ALUM & MAG HYDROXIDE-SIMETH 200-200-20 MG/5ML PO SUSP
30.0000 mL | Freq: Once | ORAL | Status: AC
  Administered 2023-07-15: 30 mL via ORAL
  Filled 2023-07-15: qty 30

## 2023-07-15 MED ORDER — OMEPRAZOLE MAGNESIUM 20 MG PO TBEC
20.0000 mg | DELAYED_RELEASE_TABLET | Freq: Every day | ORAL | 3 refills | Status: DC
Start: 2023-07-15 — End: 2023-11-13

## 2023-07-15 MED ORDER — LIDOCAINE VISCOUS HCL 2 % MT SOLN
15.0000 mL | Freq: Once | OROMUCOSAL | Status: AC
  Administered 2023-07-15: 15 mL via ORAL
  Filled 2023-07-15: qty 15

## 2023-07-15 NOTE — Discharge Instructions (Signed)
 Thank you for choosing [place] for your health care today!  Please see your primary doctor this week for a follow up appointment.   If you have any new, worsening, or unexpected symptoms call your doctor right away or come back to the emergency department for reevaluation.  It was my pleasure to care for you today.   Daneil Dan Modesto Charon, MD

## 2023-07-15 NOTE — ED Notes (Signed)
 Discharge instructions reviewed.   Newly prescribed medications discussed. Pharmacy verified.   Opportunity for questions and concerns provided.   Alert, oriented and ambulatory.   Displays no signs of distress.   Encouraged to follow up with PCP.

## 2023-07-15 NOTE — ED Provider Notes (Signed)
 [facility] Provider Note    Event Date/Time   First MD Initiated Contact with Patient 07/15/23 [phone]     (approximate)   History   Abdominal Pain and Chest Pain   HPI  Christie Green is a 28 y.o. female   Past medical history of ectopic pregnancy, status post appendectomy, daily alcohol use, who presents to the Emergency Department abdominal pain for a couple of days, vomiting and diarrhea, and then feeling chest pain today after vomiting a couple of times.  No GI bleeding reported.  No vaginal discharge or bleeding.  No urinary symptoms.  Denies fevers, shortness of breath, radiation of chest pain or exertional chest pain.  The pain is around the upper bellybutton area.  It does not radiate.  She has been drinking alcohol increasingly over the last several weeks.      Physical Exam   Triage Vital Signs: ED Triage Vitals  Encounter Vitals Group     BP 07/15/23 0143 99/72     Girls Systolic BP Percentile --      Girls Diastolic BP Percentile --      Boys Systolic BP Percentile --      Boys Diastolic BP Percentile --      Pulse Rate 07/15/23 0143 70     Resp 07/15/23 0143 18     Temp 07/15/23 0143 98.2 F (36.8 C)     Temp Source 07/15/23 0143 Oral     SpO2 07/15/23 0143 100 %     Weight 07/15/23 0141 230 lb (104.3 kg)     Height 07/15/23 0141 5' 6 (1.676 m)     Head Circumference --      Peak Flow --      Pain Score 07/15/23 0141 8     Pain Loc --      Pain Education --      Exclude from Growth Chart --     Most recent vital signs: Vitals:   07/15/23 0143 07/15/23 0642  BP: 99/72 103/81  Pulse: 70 66  Resp: 18 20  Temp: 98.2 F (36.8 C) 98.3 F (36.8 C)  SpO2: 100% 99%    General: Awake, no distress.  CV:  Good peripheral perfusion.  Resp:  Normal effort.  Abd:  No distention.  Other:  Comfortable appearing patient with normal vital signs.  Soft benign abdominal exam deep palpation all quadrants without rigidity or  guarding.   ED Results / Procedures / Treatments   Labs (all labs ordered are listed, but only abnormal results are displayed) Labs Reviewed  CBC - Abnormal; Notable for the following components:      Result Value   RBC 3.84 (*)    Hemoglobin 10.5 (*)    HCT 33.2 (*)    RDW 17.0 (*)    All other components within normal limits  COMPREHENSIVE METABOLIC PANEL WITH GFR - Abnormal; Notable for the following components:   Glucose, Bld 132 (*)    All other components within normal limits  LIPASE, BLOOD  PREGNANCY, URINE  TROPONIN I (HIGH SENSITIVITY)     I ordered and reviewed the above labs they are notable for cell counts electrolytes unremarkable, anemic with baseline H&H today.  EKG  ED ECG REPORT I, Ginnie Shams, the attending physician, personally viewed and interpreted this ECG.   Date: 07/15/2023  EKG Time: 0139  Rate: 73  Rhythm: sinus  Axis: nl  Intervals:nl  ST&T Change: no stemi    RADIOLOGY I  independently reviewed and interpreted chest x-ray I see no obvious focality pneumothorax I also reviewed radiologist's formal read.   PROCEDURES:  Critical Care performed: No  Procedures   MEDICATIONS ORDERED IN ED: Medications  alum & mag hydroxide-simeth (MAALOX/MYLANTA) 200-200-20 MG/5ML suspension 30 mL (30 mLs Oral Given 07/15/23 9357)    And  lidocaine  (XYLOCAINE ) 2 % viscous mouth solution 15 mL (15 mLs Oral Given 07/15/23 9357)  ondansetron  (ZOFRAN -ODT) disintegrating tablet 4 mg (4 mg Oral Given 07/15/23 9357)    IMPRESSION / MDM / ASSESSMENT AND PLAN / ED COURSE  I reviewed the triage vital signs and the nursing notes.                                Patient's presentation is most consistent with acute presentation with potential threat to life or bodily function.  Differential diagnosis includes, but is not limited to, viral gastroenteritis, alcoholic gastritis, considered but less likely cholecystitis, choledocholithiasis, pancreatitis,  obstruction  MDM:    Her symptoms are consistent with a viral gastroenteritis and a benign abdominal exam rules strongly against surgical abdominal pathology time.  Furthermore labs are unremarkable including LFTs.  May be some irritation from her increasing alcohol use, for which I advised her to decrease use.  Plan will be for anticipatory guidance, nausea medication, follow-up PMD.         FINAL CLINICAL IMPRESSION(S) / ED DIAGNOSES   Final diagnoses:  Nausea vomiting and diarrhea  Abdominal cramping  Alcohol use     Rx / DC Orders   ED Discharge Orders          Ordered    omeprazole  (PRILOSEC  OTC) 20 MG tablet  Daily        07/15/23 0619    ondansetron  (ZOFRAN -ODT) 4 MG disintegrating tablet  Every 8 hours PRN        07/15/23 9380             Note:  This document was prepared using Dragon voice recognition software and may include unintentional dictation errors.    Cyrena Mylar, MD 07/15/23 [phone]

## 2023-07-15 NOTE — ED Triage Notes (Signed)
 Pt to ED via POV c/o abd pain and cp. Abd pain has been going on for a couple days. Endorses vomiting and diarrhea. Says she feels bloated. Pt also reports central CP that started yesterday, nonradiating. Denies feverw, dizzines, sob

## 2023-08-08 IMAGING — US US OB COMP +14 WK
1 series · 15 of 28 positions shown · non-contrast
Comparison: none

CLINICAL DATA: Second trimester pregnancy for fetal anatomy survey.

EXAM:
OBSTETRICAL ULTRASOUND >14 WKS

[Series 1: us ob comp + 14 wk · 15 of 85 slices shown]
[im 1/85]
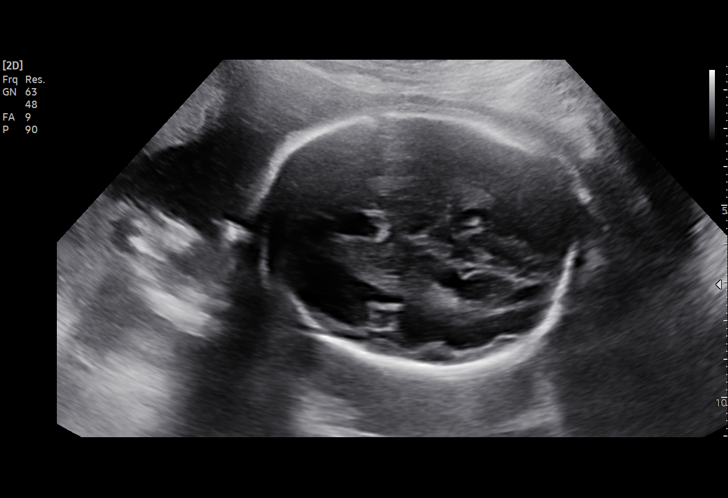
[im 7/85]
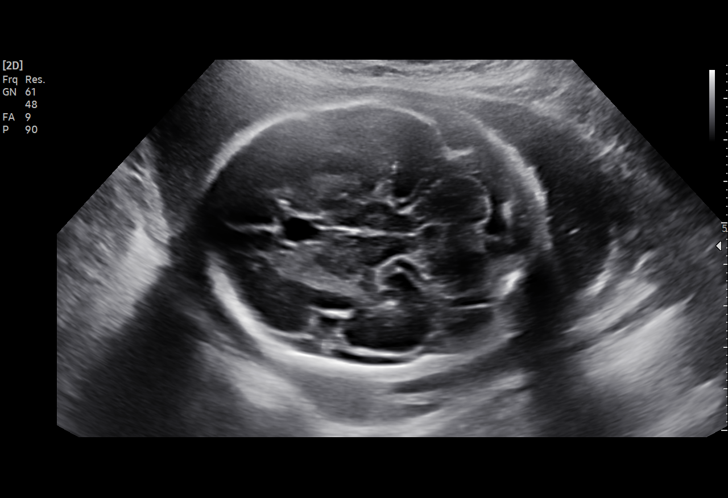
[im 13/85]
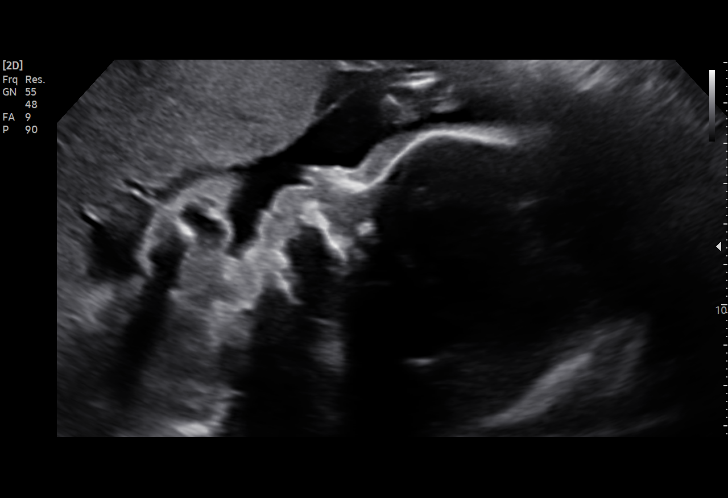
[im 19/85]
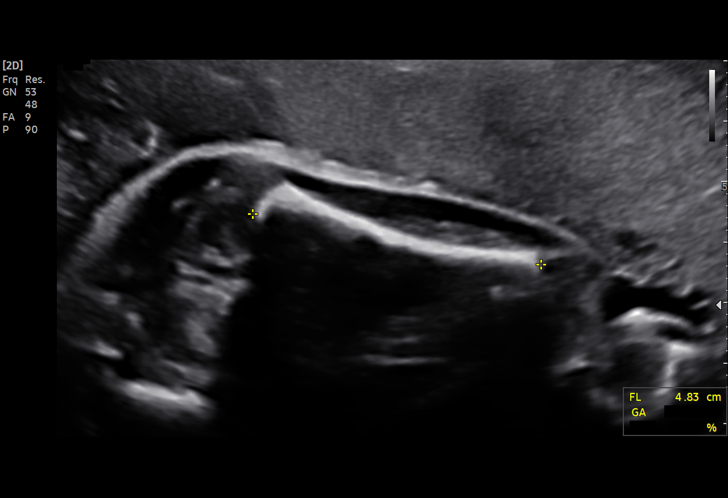
[im 25/85]
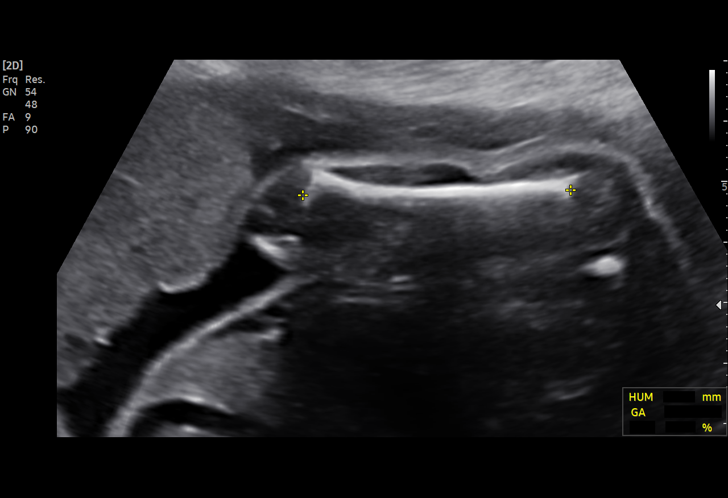
[im 32/85]
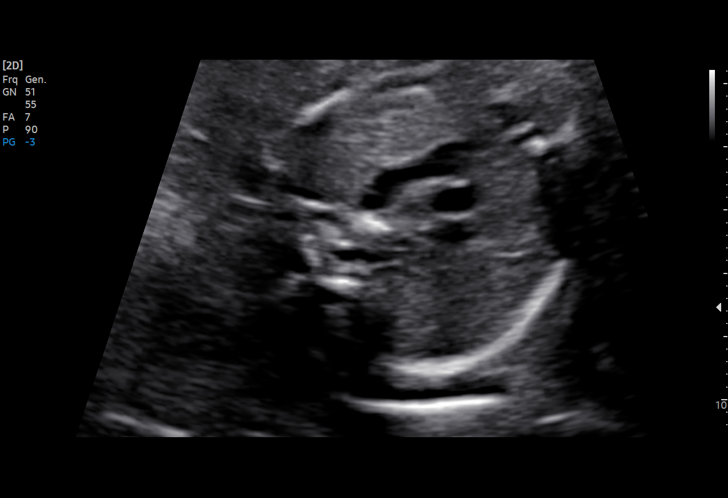
[im 38/85]
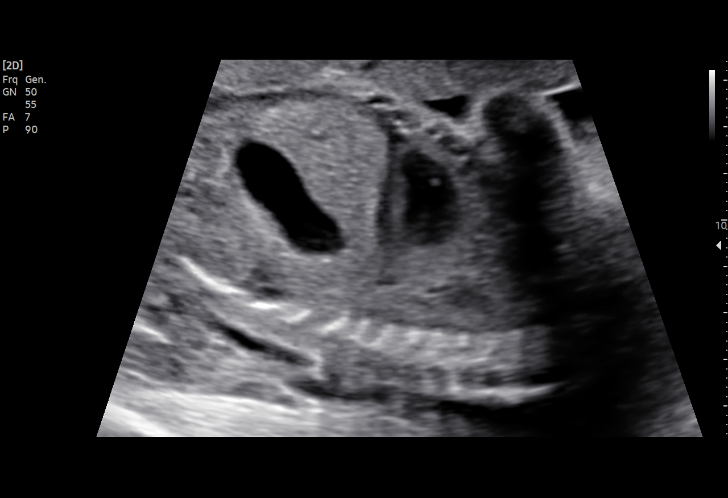
[im 44/85]
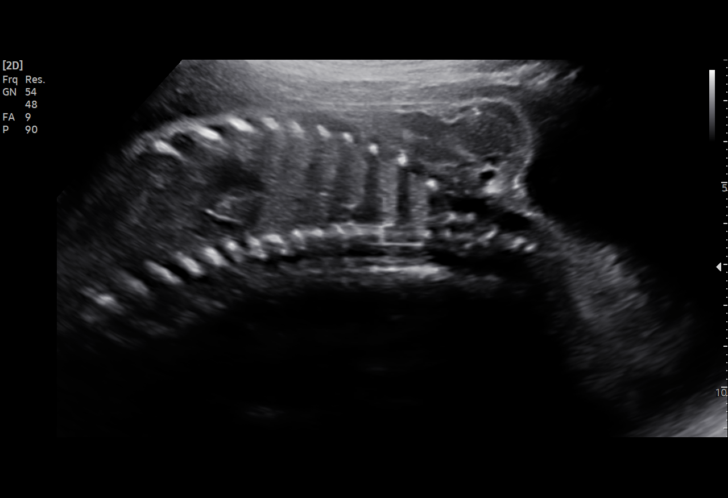
[im 47/85]
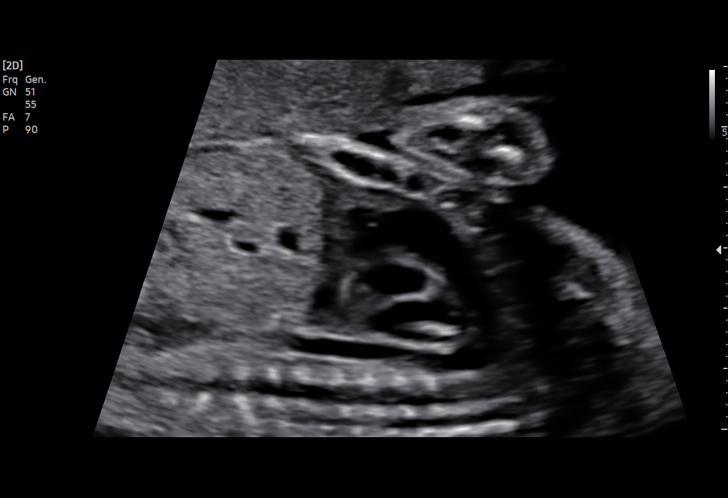
[im 53/85]
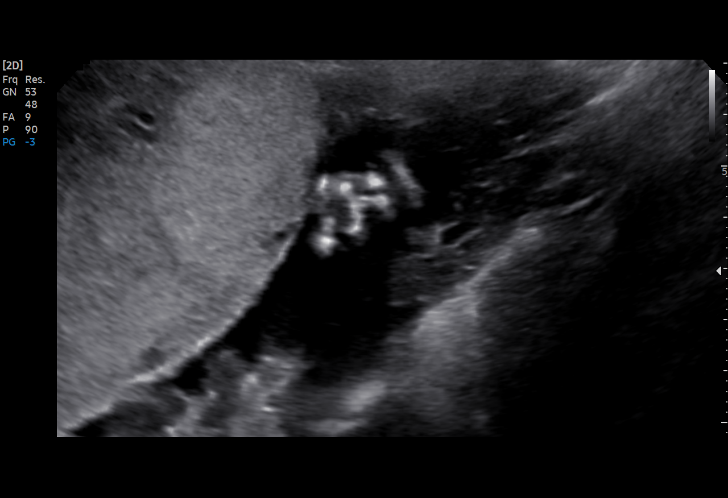
[im 60/85]
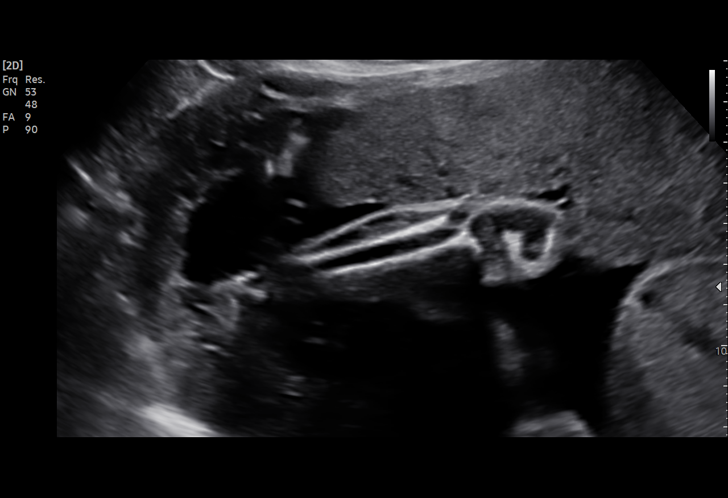
[im 66/85]
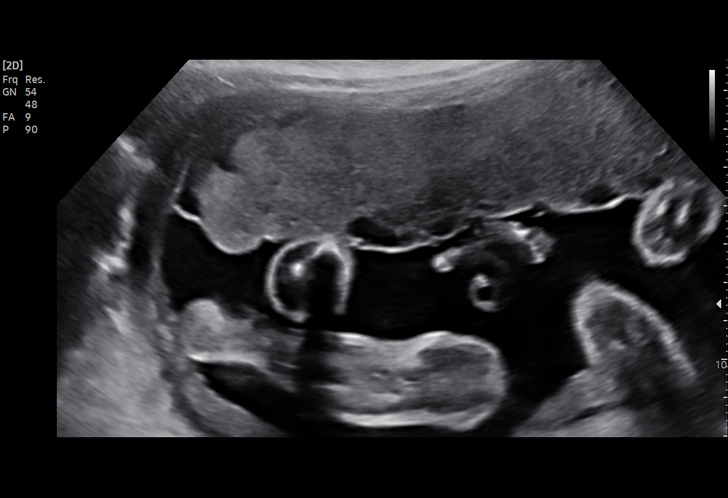
[im 72/85]
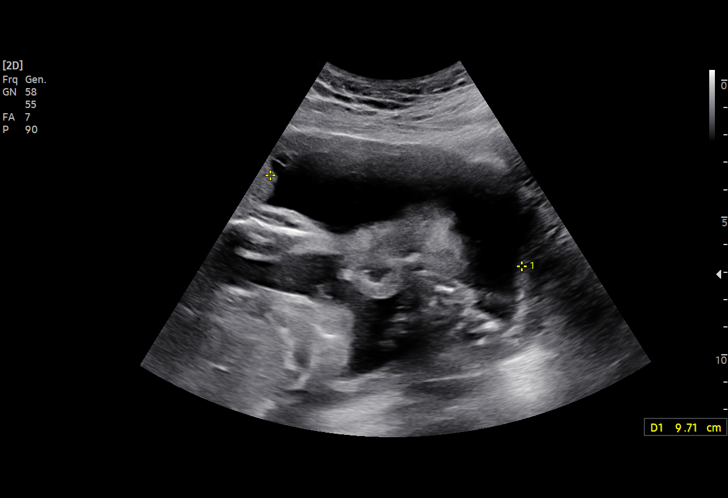
[im 78/85]
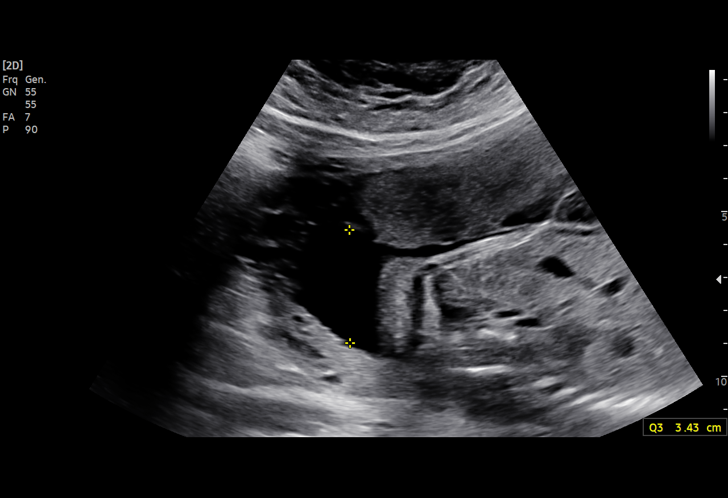
[im 85/85]
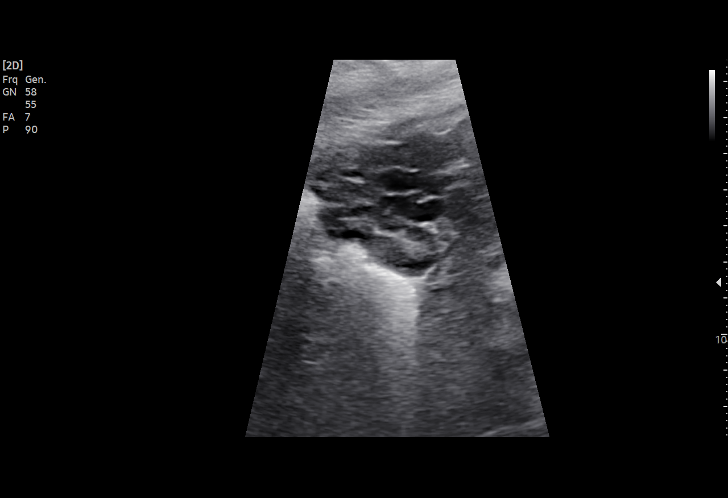

[15 of 28 positions shown; findings below may reference images not displayed]

FINDINGS: Number of Fetuses: 1

Heart Rate:  157 bpm

Movement: Yes

Presentation: Cephalic

Previa: No

Placental Location: Anterior

Amniotic Fluid (Subjective): Within normal limits

Amniotic Fluid (Objective):

Vertical pocket = 5.3cm

FETAL BIOMETRY

BPD: 6.5cm 26w 3d

HC:   24.6cm 26w 5d

AC:   22.3cm 26w 5d

FL:   4.8cm 26w 1d

Current Mean GA: 26w 3d US EDC: 12/10/2020

Assigned GA:  27w 2d Assigned EDC: 12/04/2020

FETAL ANATOMY

Lateral Ventricles: Appears normal

Thalami/CSP: Appears normal

Posterior Fossa:  Appears normal

Nuchal Region: Appears normal   NFT= N/A > 20 WKS

Upper Lip: Appears normal

Spine: Appears normal

4 Chamber Heart on Left: Appears normal

LVOT: Appears normal

RVOT: Appears normal

Stomach on Left: Appears normal

3 Vessel Cord: Appears normal

Cord Insertion site: Appears normal

Kidneys: Appears normal

Bladder: Appears normal

Extremities: Appears normal

Technically difficult due to: Fetal position

Maternal Findings:

Cervix:  3.6 cm TA
IMPRESSION: Assigned GA currently 27 weeks 2 days.  Appropriate fetal growth.

Unremarkable fetal anatomic survey.  No fetal anomalies identified.
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
## 2023-09-03 ENCOUNTER — Ambulatory Visit (INDEPENDENT_AMBULATORY_CARE_PROVIDER_SITE_OTHER)

## 2023-09-03 VITALS — BP 118/59 | HR 82 | Ht 66.0 in | Wt 238.0 lb

## 2023-09-03 DIAGNOSIS — Z3202 Encounter for pregnancy test, result negative: Secondary | ICD-10-CM | POA: Diagnosis not present

## 2023-09-03 DIAGNOSIS — Z3042 Encounter for surveillance of injectable contraceptive: Secondary | ICD-10-CM

## 2023-09-03 LAB — POCT URINE PREGNANCY: Preg Test, Ur: NEGATIVE

## 2023-09-03 MED ORDER — MEDROXYPROGESTERONE ACETATE 150 MG/ML IM SUSP
150.0000 mg | Freq: Once | INTRAMUSCULAR | Status: AC
  Administered 2023-09-03: 150 mg via INTRAMUSCULAR

## 2023-09-03 NOTE — Progress Notes (Signed)
    NURSE VISIT NOTE  Subjective:    Patient ID: Nemiah Bubar, female    DOB: 1995/05/27, 28 y.o.   MRN: [id number]  HPI  Patient is a 28 y.o. [phone] female who presents for depo provera  injection.   Objective:    BP (!) 118/59   Pulse 82   Ht 5' 6 (1.676 m)   Wt 238 lb (108 kg)   LMP 08/13/2023   BMI 38.41 kg/m   Last Annual: 02/06/22. Last pap: 02/06/22. Last Depo-Provera : 05/22/23. Side Effects if any: none. Urine HCG indicated? Yes . Depo-Provera  150 mg IM given by: Harlene Gander, CMA. Site: Left Deltoid  Lab Review  Results for orders placed or performed in visit on 09/03/23  POCT urine pregnancy  Result Value Ref Range   Preg Test, Ur Negative Negative    Assessment:   1. Encounter for surveillance of injectable contraceptive      Plan:   Next appointment due between 11/19/23 and 12/03/23. Annual scheduled. Pt aware she can not get her next depo unless she has her annual.   Harlene Gander, CMA

## 2023-09-08 ENCOUNTER — Other Ambulatory Visit: Payer: Self-pay

## 2023-09-08 ENCOUNTER — Emergency Department
Admission: EM | Admit: 2023-09-08 | Discharge: 2023-09-08 | Disposition: A | Discharge status: Home / Self Care | Attending: Emergency Medicine | Admitting: Emergency Medicine

## 2023-09-08 DIAGNOSIS — N6310 Unspecified lump in the right breast, unspecified quadrant: Secondary | ICD-10-CM | POA: Diagnosis present

## 2023-09-08 DIAGNOSIS — N61 Mastitis without abscess: Secondary | ICD-10-CM | POA: Insufficient documentation

## 2023-09-08 DIAGNOSIS — F17210 Nicotine dependence, cigarettes, uncomplicated: Secondary | ICD-10-CM | POA: Diagnosis not present

## 2023-09-08 MED ORDER — CEPHALEXIN 500 MG PO CAPS: 500.0000 mg | ORAL_CAPSULE | Freq: Four times a day (QID) | ORAL | 0 refills | Status: AC

## 2023-09-08 MED ORDER — DOXYCYCLINE MONOHYDRATE 100 MG PO TABS
100.0000 mg | ORAL_TABLET | Freq: Two times a day (BID) | ORAL | 0 refills | Status: AC
Start: 2023-09-08 — End: 2023-09-15

## 2023-09-08 NOTE — Discharge Instructions (Signed)
 Please take the antibiotics as prescribed. Return for new, worsening, or change in symptoms or other concerns. It was a pleasure caring for you today.

## 2023-09-08 NOTE — ED Notes (Signed)
 See triage note  Presents with possible abscess /hard ,tneder area to right breast  No fever or injury

## 2023-09-08 NOTE — ED Triage Notes (Signed)
 Pt to ED via POV from home. Pt reports lump under areola on right breast that is hard and tender to touch for a few days. Pt denies fevers. Pt also has abscess on outer right breast. Pain 8/10.
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
## 2023-09-17 NOTE — Patient Instructions (Incomplete)
 Preventive Care 27-27 Years Old, Female  Preventive care refers to lifestyle choices and visits with your health care provider that can promote health and wellness. Preventive care visits are also called wellness exams. What can I expect for my preventive care visit? Counseling During your preventive care visit, your health care provider may ask about your: Medical history, including: Past medical problems. Family medical history. Pregnancy history. Current health, including: Menstrual cycle. Method of birth control. Emotional well-being. Home life and relationship well-being. Sexual activity and sexual health. Lifestyle, including: Alcohol, nicotine or tobacco, and drug use. Access to firearms. Diet, exercise, and sleep habits. Work and work Astronomer. Sunscreen use. Safety issues such as seatbelt and bike helmet use. Physical exam Your health care provider may check your: Height and weight. These may be used to calculate your BMI (body mass index). BMI is a measurement that tells if you are at a healthy weight. Waist circumference. This measures the distance around your waistline. This measurement also tells if you are at a healthy weight and may help predict your risk of certain diseases, such as type 2 diabetes and high blood pressure. Heart rate and blood pressure. Body temperature. Skin for abnormal spots. What immunizations do I need?  Vaccines are usually given at various ages, according to a schedule. Your health care provider will recommend vaccines for you based on your age, medical history, and lifestyle or other factors, such as travel or where you work. What tests do I need? Screening Your health care provider may recommend screening tests for certain conditions. This may include: Pelvic exam and Pap test. Lipid and cholesterol levels. Diabetes screening. This is done by checking your blood sugar (glucose) after you have not eaten for a while  (fasting). Hepatitis B test. Hepatitis C test. HIV (human immunodeficiency virus) test. STI (sexually transmitted infection) testing, if you are at risk. BRCA-related cancer screening. This may be done if you have a family history of breast, ovarian, tubal, or peritoneal cancers. Talk with your health care provider about your test results, treatment options, and if necessary, the need for more tests. Follow these instructions at home: Eating and drinking  Eat a healthy diet that includes fresh fruits and vegetables, whole grains, lean protein, and low-fat dairy products. Take vitamin and mineral supplements as recommended by your health care provider. Do not drink alcohol if: Your health care provider tells you not to drink. You are pregnant, may be pregnant, or are planning to become pregnant. If you drink alcohol: Limit how much you have to 0-1 drink a day. Know how much alcohol is in your drink. In the [place] one drink equals one 12 oz bottle of beer (355 mL), one 5 oz glass of wine (148 mL), or one 1 oz glass of hard liquor (44 mL). Lifestyle Brush your teeth every morning and night with fluoride toothpaste. Floss one time each day. Exercise for at least 30 minutes 5 or more days each week. Do not use any products that contain nicotine or tobacco. These products include cigarettes, chewing tobacco, and vaping devices, such as e-cigarettes. If you need help quitting, ask your health care provider. Do not use drugs. If you are sexually active, practice safe sex. Use a condom or other form of protection to prevent STIs. If you do not wish to become pregnant, use a form of birth control. If you plan to become pregnant, see your health care provider for a prepregnancy visit. Find healthy ways to manage stress, such as:  Meditation, yoga, or listening to music. Journaling. Talking to a trusted person. Spending time with friends and family. Minimize exposure to UV radiation to reduce your  risk of skin cancer. Safety Always wear your seat belt while driving or riding in a vehicle. Do not drive: If you have been drinking alcohol. Do not ride with someone who has been drinking. If you have been using any mind-altering substances or drugs. While texting. When you are tired or distracted. Wear a helmet and other protective equipment during sports activities. If you have firearms in your house, make sure you follow all gun safety procedures. Seek help if you have been physically or sexually abused. What's next? Go to your health care provider once a year for an annual wellness visit. Ask your health care provider how often you should have your eyes and teeth checked. Stay up to date on all vaccines. This information is not intended to replace advice given to you by your health care provider. Make sure you discuss any questions you have with your health care provider. Document Revised: 06/21/2020 Document Reviewed: 06/21/2020 Elsevier Patient Education  2024 [employer]     How to Do a Breast Self-Exam Doing breast self-exams can help you stay healthy. They're one way to know what's normal for your breasts. They can help you catch a problem while it's still small and can be treated. You need to: Check your breasts often. Tell your doctor about any changes. You should do breast self-exams even if you have breast implants. What you need: A mirror. A well-lit room. A pillow or other soft object. How to do a breast self-exam Look for changes  Take off all the clothes above your waist. Stand in front of a mirror in a room with good lighting. Put your hands down at your sides. Compare your breasts in the mirror. Look for difference between them, such as: Differences in shape. Differences in size. Wrinkles, dips, and bumps in one breast and not the other. Look at each breast for skin changes, such as: Redness. Scaly spots. Spots where your skin is  thicker. Dimpling. Open sores. Look for changes in your nipples, such as: Fluid coming out of a nipple. Fluid around a nipple. Bleeding. Dimpling. Redness. A nipple that looks pushed in or that has changed position. Feel for changes Lie on your back. Feel each breast. To do this: Pick a breast to feel. Place a pillow under the shoulder closest to that breast. Put the arm closest to that breast behind your head. Feel the breast using the hand of your other arm. Use the pads of your three middle fingers to make small circles starting near the nipple. Use light, medium, and firm pressure. Keep making circles, moving down over the breast. Stop when you feel your ribs. Start making circles with your fingers again, this time going up until you reach your collarbone. Then, make circles out across your breast and into your armpit area. Squeeze your nipple. Check for fluid and lumps. Do these steps again to check your other breast. Sit or stand in the tub or shower. With soapy water on your skin, feel each breast the same way you did when you were lying down. Write down what you find Writing down what you find can help you keep track of what you want to tell your doctor. Write down: What's normal for each breast. Any changes you find. Write down: The kind of change. If your breast feels tender or painful.  Any lump you find. Write down its size and where it is. When you last had your period. General tips If you're breastfeeding, the best time to check your breasts is after you feed your baby or after you use a breast pump. If you get a period, the best time to check your breasts is 5-7 days after your period ends. With time, you'll get more used to doing the self-exam. You'll also start to know if there are changes in your breasts. Contact a doctor if: You see a change in the shape or size of your breasts or nipples. You see a change in the skin of your breast or nipples. You have fluid  coming from your nipples that isn't normal. You find a new lump or thick area. You have breast pain. You have any concerns about your breast health. This information is not intended to replace advice given to you by your health care provider. Make sure you discuss any questions you have with your health care provider. Document Revised: 03/05/2023 Document Reviewed: 03/05/2023 Elsevier Patient Education  2025 [employer].

## 2023-09-18 ENCOUNTER — Ambulatory Visit: Admitting: Certified Nurse Midwife

## 2023-09-18 DIAGNOSIS — Z23 Encounter for immunization: Secondary | ICD-10-CM

## 2023-09-18 DIAGNOSIS — Z131 Encounter for screening for diabetes mellitus: Secondary | ICD-10-CM

## 2023-09-18 DIAGNOSIS — Z13220 Encounter for screening for lipoid disorders: Secondary | ICD-10-CM

## 2023-09-18 DIAGNOSIS — Z01419 Encounter for gynecological examination (general) (routine) without abnormal findings: Secondary | ICD-10-CM

## 2023-09-18 DIAGNOSIS — Z113 Encounter for screening for infections with a predominantly sexual mode of transmission: Secondary | ICD-10-CM

## 2023-10-23 IMAGING — US US MFM OB DETAIL+14 WK
1 series · 13 of 28 positions shown · non-contrast
Comparison: none

[Series 1: us mfm ob detail+14 wk · 127 acquisitions, 13 frames shown]
[im 5/127]
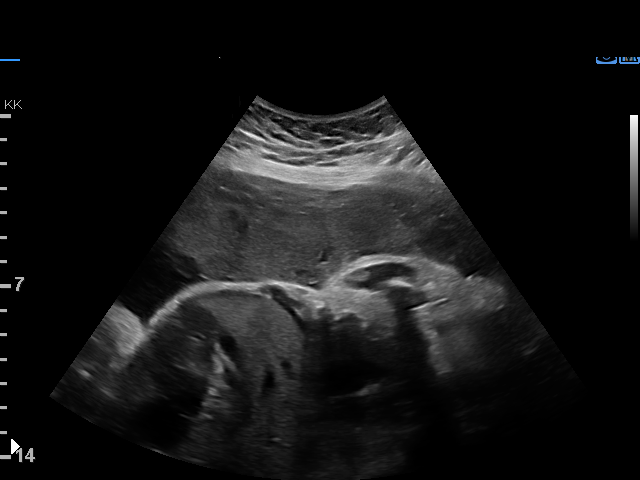
[im 15/127]
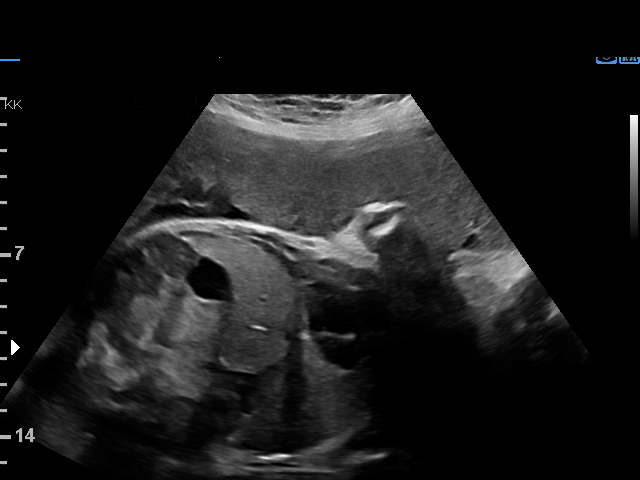
[im 24/127]
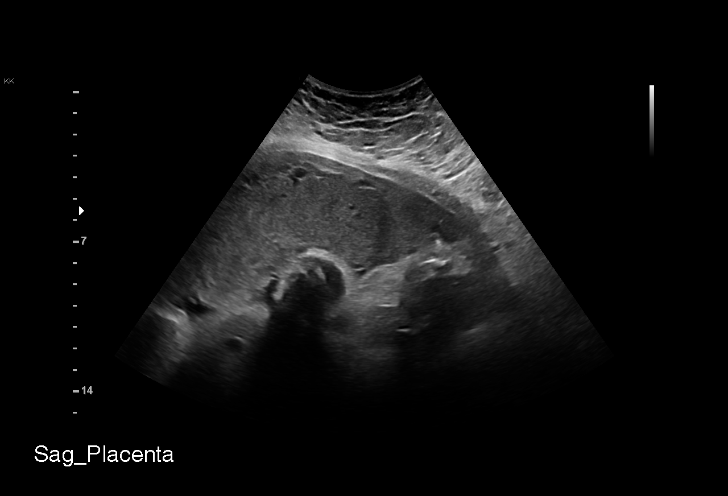
[im 33/127]
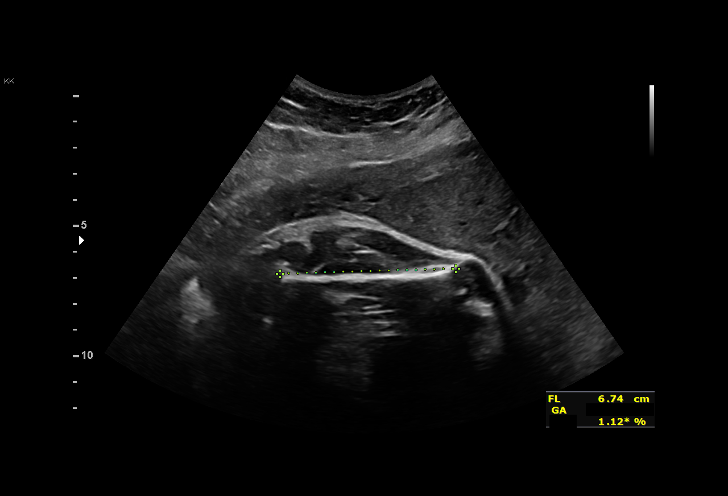
[im 43/127]
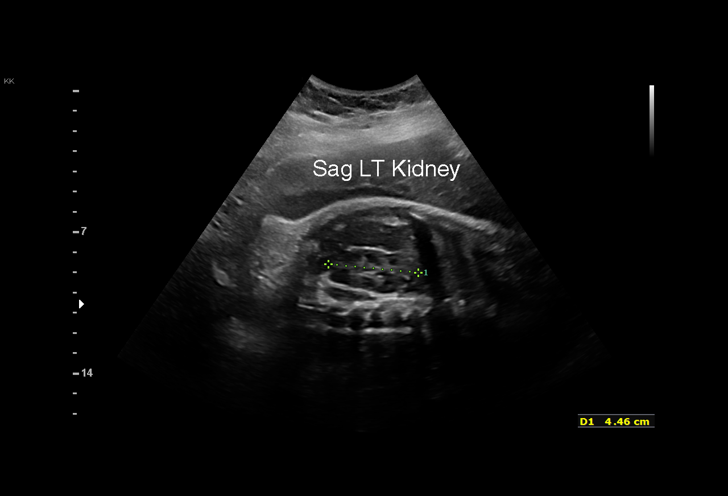
[im 52/127]
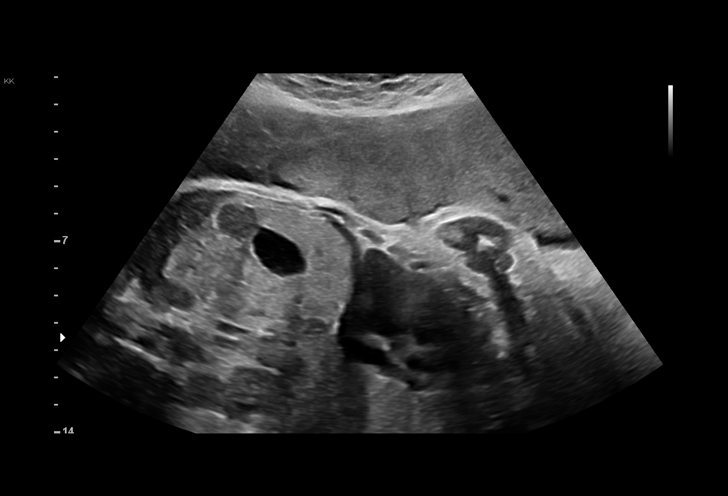
[im 66/127]
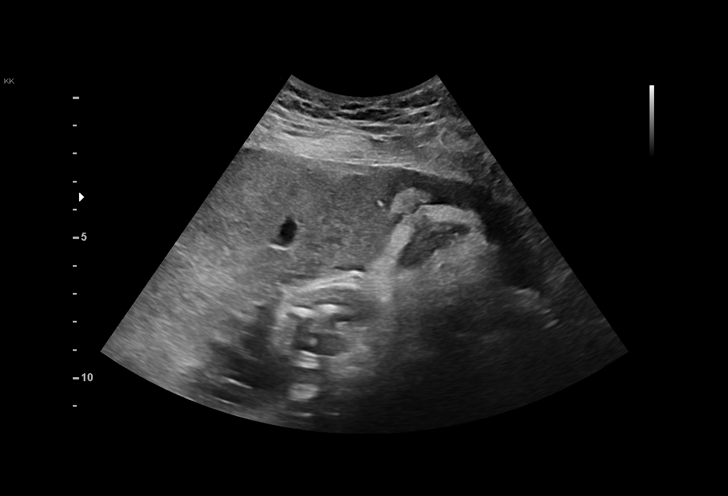
[im 75/127]
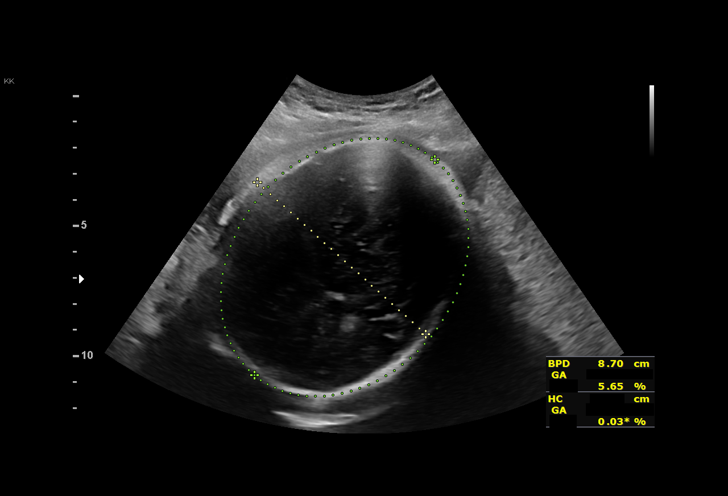
[im 85/127]
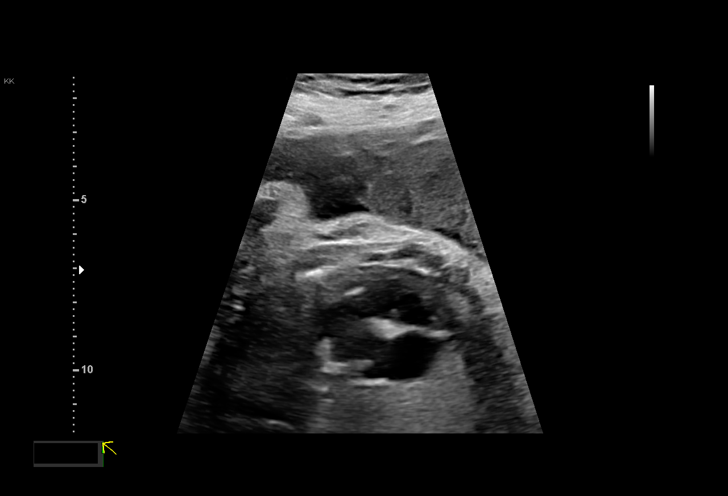
[im 94/127]
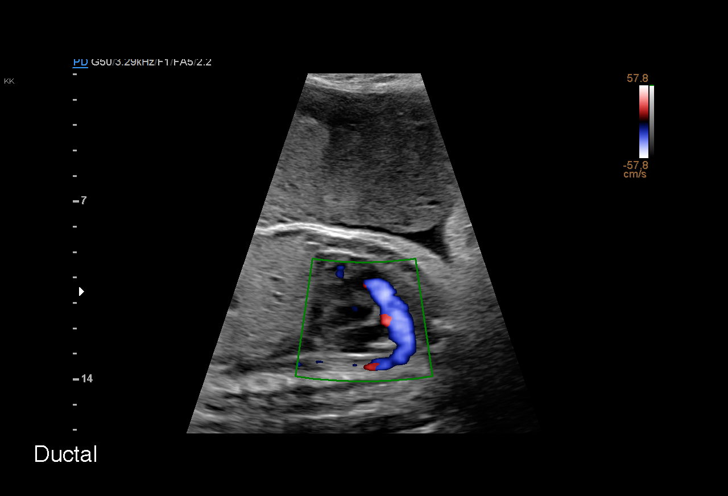
[im 103/127]
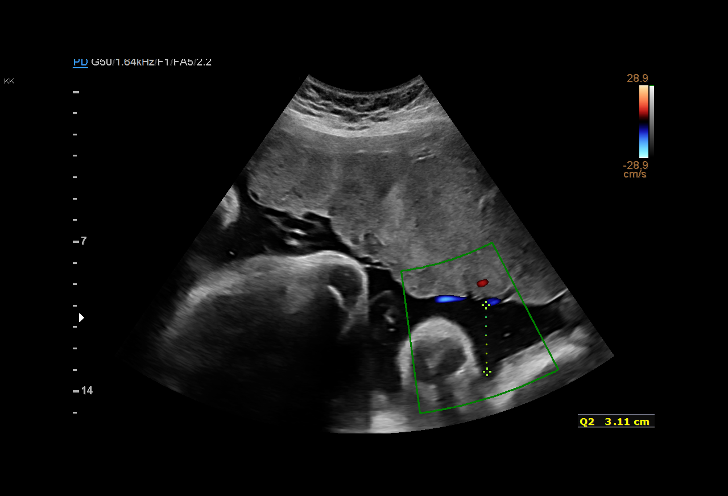
[im 113/127]
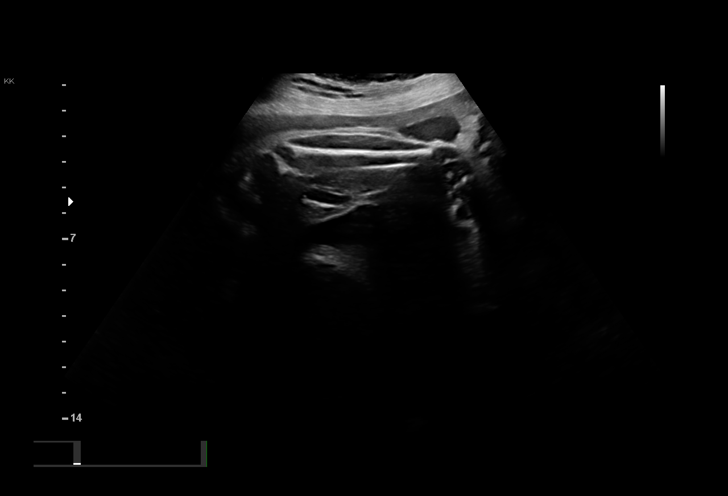
[im 122/127]
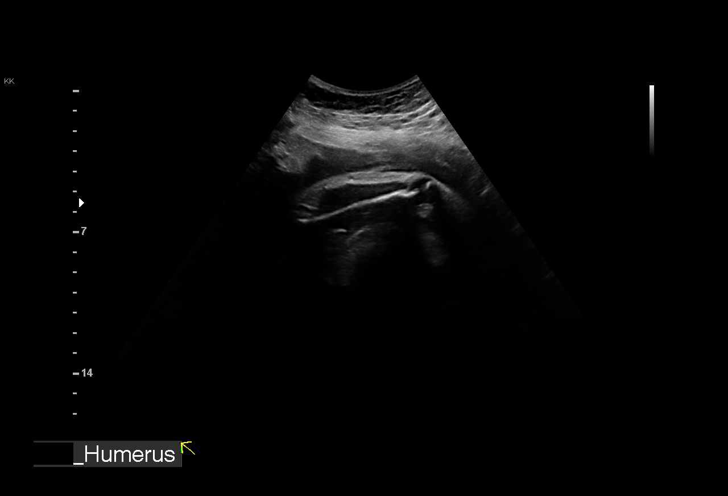

[13 of 28 positions shown; findings below may reference images not displayed]

Indications

 Obesity complicating pregnancy, third
 trimester
 Tobacco use complicating pregnancy, third
 trimester
 Insufficient Prenatal Care
 38 weeks gestation of pregnancy
 Encounter for antenatal screening for
 malformations
 PaterniK1O Negative
Vital Signs

 BMI:
Fetal Evaluation

 Num Of Fetuses:         1
 Fetal Heart Rate(bpm):  137
 Cardiac Activity:       Observed
 Presentation:           Cephalic
 Placenta:               Anterior
 P. Cord Insertion:      Visualized
 Amniotic Fluid
 AFI FV:      Within normal limits

 AFI Sum(cm)     %Tile       Largest Pocket(cm)
 13.5            52          2.

 RUQ(cm)       RLQ(cm)       LUQ(cm)        LLQ(cm)

Biophysical Evaluation

 Amniotic F.V:   Pocket => 2 cm             F. Tone:        Observed
 F. Movement:    Observed                   Score:          [DATE]
 F. Breathing:   Observed
Biometry

 BPD:      86.4  mm     G. Age:  34w 6d        3.7  %    CI:         79.3   %    70 - 86
                                                         FL/HC:      21.2   %    20.9 -
 HC:      306.7  mm     G. Age:  34w 1d        < 1  %    HC/AC:      0.90        0.92 -
 AC:      339.2  mm     G. Age:  37w 6d         60  %    FL/BPD:     75.1   %    71 - 87
 FL:       64.9  mm     G. Age:  33w 3d        < 1  %    FL/AC:      19.1   %    20 - 24
 HUM:      58.8  mm     G. Age:  34w 0d        < 5  %

 Est. FW:    1290  gm      6 lb 4 oz     15  %
OB History

 Gravidity:    3         Term:   2        Prem:   0        SAB:   0
 TOP:          0
Gestational Age

 LMP:           38w 1d        Date:  02/28/20                 EDD:   12/04/20
 U/S Today:     35w 1d                                        EDD:   12/25/20
 Best:          38w 1d     Det. By:  LMP  (02/28/20)          EDD:   12/04/20
Anatomy

 Cranium:               Appears normal         Aortic Arch:            Not well visualized
 Cavum:                 Appears normal         Ductal Arch:            Appears normal
 Ventricles:            Appears normal         Diaphragm:              Appears normal
 Choroid Plexus:        Appears normal         Stomach:                Appears normal, left
                                                                       sided
 Cerebellum:            Not well visualized    Abdomen:                Appears normal
 Posterior Fossa:       Not well visualized    Abdominal Wall:         Not well visualized
 Nuchal Fold:           Not applicable (>20    Cord Vessels:           Appears normal (3
                        wks GA)                                        vessel cord)
 Face:                  Orbits appear          Kidneys:                Appear normal
                        normal
 Lips:                  Appears normal         Bladder:                Appears normal
 Heart:                 Appears normal         Spine:                  Not well visualized
                        (4CH, axis, and
                        situs)
 RVOT:                  Appears normal         Upper Extremities:      Not well visualized
 LVOT:                  Appears normal         Lower Extremities:      Visualized

 Other:  Technically difficult due to advanced gestational age.
Doppler - Fetal Vessels

 Umbilical Artery
  S/D     %tile      RI    %tile      PI    %tile     PSV    ADFV    RDFV
                                                    (cm/s)
  2.06       34    0.51       31    0.[REDACTED]      No      No

Cervix Uterus Adnexa

 Cervix
 Not visualized (advanced GA >13wks)
Impression

 Insufficient prenatal care.  Her pregnancy is dated by LMP
 date consistent with 12-week ultrasound performed at your
 office.
 On cell free fetal DNA screening, the risks of fetal
 aneuploidies are not increased.
 Blood pressure today at her office is 117/65 mmHg.  Patient
 has not had screening for gestational diabetes.
 She reports she smokes about 5 cigarettes daily.
 Fetal growth is appropriate for gestational age.  Head
 circumference measurement is at between -2 and -1 SD.
 Amniotic fluid is normal and good fetal activity seen.  Fetal
 anatomical survey is very limited because of advanced
 gestational age.  Cephalic presentation.  Antenatal testing is
 reassuring.  BPP [DATE].
 I counseled her on the adverse effects of smoking including
 placental abruption.
Recommendations

 Weekly NST at your office or deliver at 39 weeks
                 [place], Td

## 2023-10-30 ENCOUNTER — Emergency Department

## 2023-10-30 ENCOUNTER — Emergency Department
Admission: EM | Admit: 2023-10-30 | Discharge: 2023-10-30 | Disposition: A | Discharge status: Home / Self Care | Attending: Emergency Medicine | Admitting: Emergency Medicine

## 2023-10-30 ENCOUNTER — Encounter: Payer: Self-pay | Admitting: Emergency Medicine

## 2023-10-30 ENCOUNTER — Other Ambulatory Visit: Payer: Self-pay

## 2023-10-30 DIAGNOSIS — S5011XA Contusion of right forearm, initial encounter: Secondary | ICD-10-CM | POA: Diagnosis not present

## 2023-10-30 DIAGNOSIS — R109 Unspecified abdominal pain: Secondary | ICD-10-CM | POA: Diagnosis not present

## 2023-10-30 DIAGNOSIS — S59912A Unspecified injury of left forearm, initial encounter: Secondary | ICD-10-CM | POA: Diagnosis present

## 2023-10-30 DIAGNOSIS — N939 Abnormal uterine and vaginal bleeding, unspecified: Secondary | ICD-10-CM | POA: Diagnosis not present

## 2023-10-30 DIAGNOSIS — F10920 Alcohol use, unspecified with intoxication, uncomplicated: Secondary | ICD-10-CM | POA: Insufficient documentation

## 2023-10-30 LAB — CBC WITH DIFFERENTIAL/PLATELET
Abs Immature Granulocytes: 0.04 K/uL (ref 0.00–0.07)
Basophils Absolute: 0 K/uL (ref 0.0–0.1)
Basophils Relative: 0 %
Eosinophils Absolute: 0.3 K/uL (ref 0.0–0.5)
Eosinophils Relative: 2 %
HCT: 39.7 % (ref 36.0–46.0)
Hemoglobin: 12.1 g/dL (ref 12.0–15.0)
Immature Granulocytes: 0 %
Lymphocytes Relative: 25 %
Lymphs Abs: 4.1 K/uL — ABNORMAL HIGH (ref 0.7–4.0)
MCH: 27.9 pg (ref 26.0–34.0)
MCHC: 30.5 g/dL (ref 30.0–36.0)
MCV: 91.5 fL (ref 80.0–100.0)
Monocytes Absolute: 0.9 K/uL (ref 0.1–1.0)
Monocytes Relative: 5 %
Neutro Abs: 11.1 K/uL — ABNORMAL HIGH (ref 1.7–7.7)
Neutrophils Relative %: 68 %
Platelets: 230 K/uL (ref 150–400)
RBC: 4.34 MIL/uL (ref 3.87–5.11)
RDW: 16.7 % — ABNORMAL HIGH (ref 11.5–15.5)
WBC: 16.5 K/uL — ABNORMAL HIGH (ref 4.0–10.5)
nRBC: 0 % (ref 0.0–0.2)

## 2023-10-30 LAB — HCG, QUANTITATIVE, PREGNANCY: hCG, Beta Chain, Quant, S: <1 m[IU]/mL (ref <5)

## 2023-10-30 LAB — BASIC METABOLIC PANEL WITH GFR
Anion gap: 12 (ref 5–15)
BUN: 8 mg/dL (ref 6–20)
CO2: 18 mmol/L — ABNORMAL LOW (ref 22–32)
Calcium: 8.5 mg/dL — ABNORMAL LOW (ref 8.9–10.3)
Chloride: 112 mmol/L — ABNORMAL HIGH (ref 98–111)
Creatinine, Ser: 0.62 mg/dL (ref 0.44–1.00)
GFR, Estimated: >60 mL/min (ref >60)
Glucose, Bld: 87 mg/dL (ref 70–99)
Potassium: 4.5 mmol/L (ref 3.5–5.1)
Sodium: 142 mmol/L (ref 135–145)

## 2023-10-30 MED ORDER — MEDROXYPROGESTERONE ACETATE 10 MG PO TABS: 20.0000 mg | ORAL_TABLET | Freq: Two times a day (BID) | ORAL | 0 refills | Status: DC

## 2023-10-30 MED ORDER — KETOROLAC TROMETHAMINE 15 MG/ML IJ SOLN
15.0000 mg | Freq: Once | INTRAMUSCULAR | Status: AC
  Administered 2023-10-30: 15 mg via INTRAVENOUS

## 2023-10-30 MED ORDER — KETOROLAC TROMETHAMINE 15 MG/ML IJ SOLN
15.0000 mg | Freq: Once | INTRAMUSCULAR | Status: DC
  Filled 2023-10-30: qty 1
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
## 2023-10-30 NOTE — ED Notes (Signed)
 Pt called their ride and they are going to come pick her up. Pt is waiting in lobby for ride
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
## 2023-10-30 NOTE — Discharge Instructions (Signed)
 Your xrays and lab tests were normal today. Take Provera  as prescribed to help stop your vaginal bleeding. Please make an appointment to follow up with gynecology.

## 2023-10-30 NOTE — Progress Notes (Deleted)
    GYNECOLOGY PROGRESS NOTE  Subjective:    Patient ID: Christie Green, female    DOB: [birth year], 28 y.o.   MRN: [id number]  HPI  Patient is a 28 y.o. [id number] female who presents for   {Common ambulatory SmartLinks:19316}  Review of Systems {ros; complete:30496}   Objective:   There were no vitals taken for this visit. There is no height or weight on file to calculate BMI. General appearance: {general exam:16600} Abdomen: {abdominal exam:16834} Pelvic: {pelvic exam:16852::cervix normal in appearance,external genitalia normal,no adnexal masses or tenderness,no cervical motion tenderness,rectovaginal septum normal,uterus normal size, shape, and consistency,vagina normal without discharge} Extremities: {extremity exam:5109} Neurologic: {neuro exam:17854}   Assessment:   No diagnosis found.   Plan:   There are no diagnoses linked to this encounter.

## 2023-10-31 ENCOUNTER — Ambulatory Visit: Admitting: Certified Nurse Midwife

## 2023-11-07 ENCOUNTER — Emergency Department

## 2023-11-07 ENCOUNTER — Other Ambulatory Visit: Payer: Self-pay

## 2023-11-07 ENCOUNTER — Emergency Department: Admission: EM | Admit: 2023-11-07 | Discharge: 2023-11-08 | Disposition: A | Discharge status: Home / Self Care

## 2023-11-07 ENCOUNTER — Encounter: Payer: Self-pay | Admitting: Emergency Medicine

## 2023-11-07 DIAGNOSIS — N611 Abscess of the breast and nipple: Secondary | ICD-10-CM | POA: Diagnosis not present

## 2023-11-07 DIAGNOSIS — S5011XA Contusion of right forearm, initial encounter: Secondary | ICD-10-CM | POA: Insufficient documentation

## 2023-11-07 DIAGNOSIS — W228XXA Striking against or struck by other objects, initial encounter: Secondary | ICD-10-CM | POA: Insufficient documentation

## 2023-11-07 DIAGNOSIS — S59911A Unspecified injury of right forearm, initial encounter: Secondary | ICD-10-CM | POA: Diagnosis present
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
## 2023-11-07 NOTE — ED Provider Notes (Incomplete)
   [facility] Provider Note    Event Date/Time   First MD Initiated Contact with Patient 11/07/23 2322     (approximate)   History   Breast Problem and Arm Pain   HPI  Christie Green is a 28 y.o. female  and as listed in EMR presents to the emergency department for ***.     Physical Exam    Vitals:   11/07/23 2142  BP: 108/68  Pulse: 72  Resp: 16  Temp: 97.7 F (36.5 C)  SpO2: 100%    General: Awake, no distress. *** CV:  Good peripheral perfusion. *** Resp:  Normal effort. *** Abd:  No distention. *** Other:  ***   ED Results / Procedures / Treatments   Labs (all labs ordered are listed, but only abnormal results are displayed)  Labs Reviewed - No data to display   EKG  ***   RADIOLOGY  Image and radiology report reviewed and interpreted by me. Radiology report consistent with the same.  ***  PROCEDURES:  Critical Care performed: {CriticalCareYesNo:19197::Yes, see critical care procedure note(s),No}  Procedures   MEDICATIONS ORDERED IN ED:  Medications - No data to display   IMPRESSION / MDM / ASSESSMENT AND PLAN / ED COURSE   I have reviewed the triage note and vital signs. Vital signs ***   Differential diagnosis includes, but is not limited to, ***  Patient's presentation is most consistent with {EM COPA:27473}  {**The patient is on the cardiac monitor to evaluate for evidence of arrhythmia and/or significant heart rate changes.**}      FINAL CLINICAL IMPRESSION(S) / ED DIAGNOSES   Final diagnoses:  None     Rx / DC Orders   ED Discharge Orders     None        Note:  This document was prepared using Dragon voice recognition software and may include unintentional dictation errors.

## 2023-11-08 LAB — CBC WITH DIFFERENTIAL/PLATELET
Abs Immature Granulocytes: 0.03 K/uL (ref 0.00–0.07)
Basophils Absolute: 0 K/uL (ref 0.0–0.1)
Basophils Relative: 0 %
Eosinophils Absolute: 0.2 K/uL (ref 0.0–0.5)
Eosinophils Relative: 1 %
HCT: 37.1 % (ref 36.0–46.0)
Hemoglobin: 11.7 g/dL — ABNORMAL LOW (ref 12.0–15.0)
Immature Granulocytes: 0 %
Lymphocytes Relative: 32 %
Lymphs Abs: 3.9 K/uL (ref 0.7–4.0)
MCH: 28.1 pg (ref 26.0–34.0)
MCHC: 31.5 g/dL (ref 30.0–36.0)
MCV: 89.2 fL (ref 80.0–100.0)
Monocytes Absolute: 0.7 K/uL (ref 0.1–1.0)
Monocytes Relative: 6 %
Neutro Abs: 7.3 K/uL (ref 1.7–7.7)
Neutrophils Relative %: 61 %
Platelets: 254 K/uL (ref 150–400)
RBC: 4.16 MIL/uL (ref 3.87–5.11)
RDW: 16.2 % — ABNORMAL HIGH (ref 11.5–15.5)
WBC: 12.1 K/uL — ABNORMAL HIGH (ref 4.0–10.5)
nRBC: 0 % (ref 0.0–0.2)

## 2023-11-08 LAB — COMPREHENSIVE METABOLIC PANEL WITH GFR
ALT: 23 U/L (ref 0–44)
AST: 18 U/L (ref 15–41)
Albumin: 3.8 g/dL (ref 3.5–5.0)
Alkaline Phosphatase: 113 U/L (ref 38–126)
Anion gap: 10 (ref 5–15)
BUN: 13 mg/dL (ref 6–20)
CO2: 22 mmol/L (ref 22–32)
Calcium: 8.9 mg/dL (ref 8.9–10.3)
Chloride: 103 mmol/L (ref 98–111)
Creatinine, Ser: 0.77 mg/dL (ref 0.44–1.00)
GFR, Estimated: >60 mL/min (ref >60)
Glucose, Bld: 89 mg/dL (ref 70–99)
Potassium: 3.6 mmol/L (ref 3.5–5.1)
Sodium: 135 mmol/L (ref 135–145)
Total Bilirubin: 0.5 mg/dL (ref 0.0–1.2)
Total Protein: 8.1 g/dL (ref 6.5–8.1)

## 2023-11-08 MED ORDER — LIDOCAINE HCL (PF) 1 % IJ SOLN
INTRAMUSCULAR | Status: AC
  Filled 2023-11-08: qty 5

## 2023-11-08 MED ORDER — LIDOCAINE HCL (CARDIAC) PF 100 MG/5ML IV SOSY
PREFILLED_SYRINGE | INTRAVENOUS | Status: AC
  Filled 2023-11-08: qty 5

## 2023-11-08 MED ORDER — CEPHALEXIN 500 MG PO CAPS: 500.0000 mg | ORAL_CAPSULE | Freq: Four times a day (QID) | ORAL | 0 refills | Status: AC

## 2023-11-08 MED ORDER — OXYCODONE-ACETAMINOPHEN 5-325 MG PO TABS
1.0000 | ORAL_TABLET | Freq: Once | ORAL | Status: AC
  Administered 2023-11-08: 1 via ORAL
  Filled 2023-11-08: qty 1

## 2023-11-08 MED ORDER — DOXYCYCLINE HYCLATE 100 MG PO TABS: 100.0000 mg | ORAL_TABLET | Freq: Two times a day (BID) | ORAL | 0 refills | Status: AC

## 2023-11-08 NOTE — Discharge Instructions (Signed)
 Take the antibiotics and finish the full course.  You should apply warm compresses several times throughout the day to help the fluid collection drain.  Follow-up with a general surgeon within 1 to 2 weeks.  He should call Monday for an appointment.  In the meantime, return to the ER for new, worsening, or persistent swelling, bleeding or pus drainage, rash or redness spreading around the breast, fever or chills, or any other new or worsening symptoms that concern you.
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
## 2023-11-13 ENCOUNTER — Encounter: Payer: Self-pay | Admitting: General Surgery

## 2023-11-13 ENCOUNTER — Ambulatory Visit: Payer: Self-pay | Admitting: General Surgery

## 2023-11-13 ENCOUNTER — Telehealth: Payer: Self-pay | Admitting: General Surgery

## 2023-11-13 VITALS — BP 120/73 | HR 78 | Temp 98.8°F | Ht 66.0 in | Wt 233.8 lb

## 2023-11-13 DIAGNOSIS — N611 Abscess of the breast and nipple: Secondary | ICD-10-CM

## 2023-11-13 MED ORDER — CHLORHEXIDINE GLUCONATE CLOTH 2 % EX PADS: 6.0000 | MEDICATED_PAD | Freq: Once | CUTANEOUS | Status: DC

## 2023-11-13 MED ORDER — CHLORHEXIDINE GLUCONATE 0.12 % MT SOLN
15.0000 mL | Freq: Once | OROMUCOSAL | Status: AC
  Administered 2023-11-14: 15 mL via OROMUCOSAL

## 2023-11-13 MED ORDER — CHLORHEXIDINE GLUCONATE CLOTH 2 % EX PADS
6.0000 | MEDICATED_PAD | Freq: Once | CUTANEOUS | Status: AC
  Administered 2023-11-14: 6 via TOPICAL

## 2023-11-13 MED ORDER — CEFAZOLIN SODIUM-DEXTROSE 2-4 GM/100ML-% IV SOLN
2.0000 g | INTRAVENOUS | Status: AC
  Administered 2023-11-14: 2 g via INTRAVENOUS

## 2023-11-13 MED ORDER — LACTATED RINGERS IV SOLN: INTRAVENOUS | Status: DC

## 2023-11-13 MED ORDER — ORAL CARE MOUTH RINSE: 15.0000 mL | Freq: Once | OROMUCOSAL | Status: AC

## 2023-11-13 NOTE — Patient Instructions (Addendum)
Incision and Drainage Incision and drainage is a procedure to open and drain a fluid-filled sac. The sac may be filled with pus, mucus, or blood. Sacs that may need to be drained include cysts, skin infections (abscesses), and red lumps called boils that form from a small abscess or after a cyst bursts. You may need this procedure if you have a fluid-filled sac that is large, painful, infected, or not healing well. Tell a health care provider about: Any allergies you have. All medicines you are taking, including vitamins, herbs, eye drops, creams, and over-the-counter medicines. Any problems you or family members have had with anesthesia. Any bleeding problems you have. Any surgeries you have had. Any medical conditions you have. Any medical implants you have in your body. This includes a pacemaker or joint replacement. What are the risks? Your health care provider will talk with you about risks. These may include: Infection. Bleeding. Allergic reactions to medicines. Scarring. The return of the cyst or abscess. Damage to nerves or blood vessels. What happens before the procedure? Medicine Ask your provider about: Changing or stopping your regular medicines. These include any diabetes medicines or blood thinners you take. Taking medicines such as aspirin and ibuprofen. These medicines can thin your blood. Do not take them unless your provider tells you to. Taking over-the-counter medicines, vitamins, herbs, and supplements. You may need to start taking antibiotics before your procedure. Tests You may have tests. These may include: Imaging tests, such as an X-ray or ultrasound. These may be done to see how large or deep the fluid-filled sac is. Blood tests. Electrocardiogram (ECG). This test checks your heart rhythm. Surgery safety Ask your provider: How your surgery site will be marked. What steps will be taken to help prevent infection. These steps may include: Removing hair at  the surgery site. Washing skin with a soap that kills germs. Receiving antibiotics. General instructions Follow instructions from your provider about what you may eat and drink. Do not use any products that contain nicotine or tobacco for at least 4 weeks before the procedure. These products include cigarettes, chewing tobacco, and vaping devices, such as e-cigarettes. If you need help quitting, ask your provider. You may need to get a tetanus shot. If you will be going home right after the procedure, plan to have a responsible adult: Take you home from the hospital or clinic. You will not be allowed to drive. Care for you for the time you are told. What happens during the procedure?  An IV may be inserted into one of your veins. You may be given: A sedative. This helps you relax. Anesthesia. This keeps you from feeling pain. It will numb certain areas of your body. An incision will be made in the top of the fluid-filled sac. Pus, blood, and mucus will be squeezed out. A syringe or tube (drain) may be used to empty more fluid from the sac. The inside of the sac may be washed out (irrigated) with a germ-free solution. Depending on the size of your abscess: The drain may be left in place for a few weeks. The edges of the incision may be stitched open to make a long-term opening for drainage. A gauze packing may be placed inside the incision. A sample of the drainage fluid may be taken to do a culture. This is when the sample is checked for bacteria. The incision may be covered with a bandage (dressing). The procedure may vary among providers and hospitals. What happens after the procedure?  Your blood pressure, heart rate, breathing rate, and blood oxygen level will be monitored until you leave the hospital or clinic. You will be given medicine for pain. You will be watched for any signs of infection. If you were given a sedative during the procedure, it can affect you for several hours.  Do not drive or operate machinery until your provider says that it is safe. This information is not intended to replace advice given to you by your health care provider. Make sure you discuss any questions you have with your health care provider. Document Revised: 08/13/2021 Document Reviewed: 08/13/2021 Elsevier Patient Education  2024 [employer].

## 2023-11-13 NOTE — Telephone Encounter (Signed)
 Patient has been advised of Pre-Admission date/time, and Surgery date at [facility].  Surgery Date: 11/14/23 Preadmission Testing Date: 2 hrs early   Patient informed of the scheduling process and surgery information given at time of office visit.  Patient has been made aware to call [phone], between 1-3:00pm the day before surgery, to find out what time to arrive for surgery.

## 2023-11-14 ENCOUNTER — Ambulatory Visit: Admitting: Anesthesiology

## 2023-11-14 ENCOUNTER — Ambulatory Visit
Admission: RE | Admit: 2023-11-14 | Discharge: 2023-11-14 | Disposition: A | Discharge status: Home / Self Care | Attending: General Surgery | Admitting: General Surgery

## 2023-11-14 ENCOUNTER — Encounter
Admission: RE | Disposition: A | Discharge status: Home / Self Care | Payer: Self-pay | Source: Home / Self Care | Attending: General Surgery

## 2023-11-14 ENCOUNTER — Encounter: Payer: Self-pay | Admitting: General Surgery

## 2023-11-14 ENCOUNTER — Other Ambulatory Visit: Payer: Self-pay

## 2023-11-14 DIAGNOSIS — N9489 Other specified conditions associated with female genital organs and menstrual cycle: Secondary | ICD-10-CM

## 2023-11-14 DIAGNOSIS — Z87891 Personal history of nicotine dependence: Secondary | ICD-10-CM | POA: Diagnosis not present

## 2023-11-14 DIAGNOSIS — N611 Abscess of the breast and nipple: Secondary | ICD-10-CM | POA: Diagnosis present

## 2023-11-14 DIAGNOSIS — K219 Gastro-esophageal reflux disease without esophagitis: Secondary | ICD-10-CM | POA: Insufficient documentation

## 2023-11-14 DIAGNOSIS — E66813 Obesity, class 3: Secondary | ICD-10-CM | POA: Diagnosis not present

## 2023-11-14 DIAGNOSIS — Z6837 Body mass index (BMI) 37.0-37.9, adult: Secondary | ICD-10-CM | POA: Diagnosis not present

## 2023-11-14 DIAGNOSIS — J449 Chronic obstructive pulmonary disease, unspecified: Secondary | ICD-10-CM | POA: Diagnosis not present

## 2023-11-14 HISTORY — PX: INCISION AND DRAINAGE, ABSCESS, BREAST: SHX7594

## 2023-11-14 LAB — POCT PREGNANCY, URINE: Preg Test, Ur: NEGATIVE

## 2023-11-14 SURGERY — INCISION AND DRAINAGE, ABSCESS, BREAST
Anesthesia: General | Site: Breast | Laterality: Right

## 2023-11-14 MED ORDER — CEFAZOLIN SODIUM-DEXTROSE 2-4 GM/100ML-% IV SOLN
INTRAVENOUS | Status: AC
  Filled 2023-11-14: qty 100

## 2023-11-14 MED ORDER — DEXMEDETOMIDINE HCL IN NACL 80 MCG/20ML IV SOLN
INTRAVENOUS | Status: DC | PRN
Start: 2023-11-14 — End: 2023-11-14
  Administered 2023-11-14 (×2): 8 ug via INTRAVENOUS

## 2023-11-14 MED ORDER — ONDANSETRON HCL 4 MG/2ML IJ SOLN
INTRAMUSCULAR | Status: AC
Start: 2023-11-14 — End: 2023-11-14
  Filled 2023-11-14: qty 2

## 2023-11-14 MED ORDER — PROPOFOL 10 MG/ML IV BOLUS
INTRAVENOUS | Status: AC
  Filled 2023-11-14: qty 20

## 2023-11-14 MED ORDER — BUPIVACAINE-EPINEPHRINE 0.5% -1:200000 IJ SOLN
INTRAMUSCULAR | Status: DC | PRN
  Administered 2023-11-14: 10 mL via INTRAMUSCULAR

## 2023-11-14 MED ORDER — BUPIVACAINE-EPINEPHRINE (PF) 0.5% -1:200000 IJ SOLN
INTRAMUSCULAR | Status: AC
  Filled 2023-11-14: qty 10

## 2023-11-14 MED ORDER — FENTANYL CITRATE (PF) 100 MCG/2ML IJ SOLN
25.0000 ug | INTRAMUSCULAR | Status: DC | PRN
  Administered 2023-11-14 (×4): 25 ug via INTRAVENOUS

## 2023-11-14 MED ORDER — MIDAZOLAM HCL 2 MG/2ML IJ SOLN
INTRAMUSCULAR | Status: AC
  Filled 2023-11-14: qty 2

## 2023-11-14 MED ORDER — ESMOLOL HCL 100 MG/10ML IV SOLN
INTRAVENOUS | Status: DC | PRN
  Administered 2023-11-14: 20 mg via INTRAVENOUS

## 2023-11-14 MED ORDER — LIDOCAINE HCL (PF) 2 % IJ SOLN
INTRAMUSCULAR | Status: DC | PRN
  Administered 2023-11-14: 100 mg via INTRADERMAL

## 2023-11-14 MED ORDER — PROPOFOL 10 MG/ML IV BOLUS
INTRAVENOUS | Status: DC | PRN
  Administered 2023-11-14: 80 mg via INTRAVENOUS
  Administered 2023-11-14: 200 mg via INTRAVENOUS

## 2023-11-14 MED ORDER — CHLORHEXIDINE GLUCONATE 0.12 % MT SOLN
OROMUCOSAL | Status: AC
  Filled 2023-11-14: qty 15

## 2023-11-14 MED ORDER — DEXMEDETOMIDINE HCL IN NACL 80 MCG/20ML IV SOLN
INTRAVENOUS | Status: AC
  Filled 2023-11-14: qty 20

## 2023-11-14 MED ORDER — FENTANYL CITRATE (PF) 100 MCG/2ML IJ SOLN
INTRAMUSCULAR | Status: DC | PRN
  Administered 2023-11-14 (×2): 50 ug via INTRAVENOUS

## 2023-11-14 MED ORDER — MIDAZOLAM HCL (PF) 2 MG/2ML IJ SOLN
INTRAMUSCULAR | Status: DC | PRN
Start: 2023-11-14 — End: 2023-11-14
  Administered 2023-11-14: 2 mg via INTRAVENOUS

## 2023-11-14 MED ORDER — OXYCODONE HCL 5 MG/5ML PO SOLN: 5.0000 mg | Freq: Once | ORAL | Status: AC | PRN

## 2023-11-14 MED ORDER — OXYCODONE HCL 5 MG PO TABS: 5.0000 mg | ORAL_TABLET | Freq: Four times a day (QID) | ORAL | 0 refills | Status: AC | PRN

## 2023-11-14 MED ORDER — DEXAMETHASONE SOD PHOSPHATE PF 10 MG/ML IJ SOLN
INTRAMUSCULAR | Status: DC | PRN
  Administered 2023-11-14: 5 mg via INTRAVENOUS

## 2023-11-14 MED ORDER — GLYCOPYRROLATE 0.2 MG/ML IJ SOLN
INTRAMUSCULAR | Status: DC | PRN
  Administered 2023-11-14: .1 mg via INTRAVENOUS

## 2023-11-14 MED ORDER — OXYCODONE HCL 5 MG PO TABS
5.0000 mg | ORAL_TABLET | Freq: Once | ORAL | Status: AC | PRN
  Administered 2023-11-14: 5 mg via ORAL

## 2023-11-14 MED ORDER — FENTANYL CITRATE (PF) 100 MCG/2ML IJ SOLN
INTRAMUSCULAR | Status: AC
  Filled 2023-11-14: qty 2

## 2023-11-14 MED ORDER — ONDANSETRON HCL 4 MG/2ML IJ SOLN
INTRAMUSCULAR | Status: DC | PRN
Start: 2023-11-14 — End: 2023-11-14
  Administered 2023-11-14: 4 mg via INTRAVENOUS

## 2023-11-14 MED ORDER — ONDANSETRON HCL 4 MG/2ML IJ SOLN: 4.0000 mg | Freq: Once | INTRAMUSCULAR | Status: DC | PRN

## 2023-11-14 MED ORDER — LIDOCAINE HCL (PF) 2 % IJ SOLN
INTRAMUSCULAR | Status: AC
  Filled 2023-11-14: qty 5

## 2023-11-14 MED ORDER — ALBUTEROL SULFATE HFA 108 (90 BASE) MCG/ACT IN AERS
INHALATION_SPRAY | RESPIRATORY_TRACT | Status: DC | PRN
  Administered 2023-11-14: 4 via RESPIRATORY_TRACT

## 2023-11-14 MED ORDER — GLYCOPYRROLATE 0.2 MG/ML IJ SOLN
INTRAMUSCULAR | Status: AC
Start: 2023-11-14 — End: 2023-11-14
  Filled 2023-11-14: qty 1

## 2023-11-14 MED ORDER — OXYCODONE HCL 5 MG PO TABS
ORAL_TABLET | ORAL | Status: AC
  Filled 2023-11-14: qty 1

## 2023-11-14 SURGICAL SUPPLY — 23 items
CHLORAPREP W/TINT 26 (MISCELLANEOUS) IMPLANT
DRAPE LAPAROTOMY 100X77 ABD (DRAPES) ×1 IMPLANT
ELECTRODE REM PT RTRN 9FT ADLT (ELECTROSURGICAL) ×1 IMPLANT
GAUZE 4X4 16PLY ~~LOC~~+RFID DBL (SPONGE) IMPLANT
GAUZE STRETCH 2X75IN STRL (MISCELLANEOUS) IMPLANT
GLOVE BIOGEL PI IND STRL 7.5 (GLOVE) ×1 IMPLANT
GLOVE SURG SYN 7.0 PF PI (GLOVE) ×1 IMPLANT
GOWN STRL REUS W/ TWL LRG LVL3 (GOWN DISPOSABLE) ×2 IMPLANT
MANIFOLD NEPTUNE II (INSTRUMENTS) ×1 IMPLANT
NDL HYPO 22X1.5 SAFETY MO (MISCELLANEOUS) IMPLANT
NEEDLE HYPO 22X1.5 SAFETY MO (MISCELLANEOUS) IMPLANT
PACK BASIN MINOR ARMC (MISCELLANEOUS) ×1 IMPLANT
PAD ABD DERMACEA PRESS 5X9 (GAUZE/BANDAGES/DRESSINGS) IMPLANT
SOLN 0.9% NACL POUR BTL 1000ML (IV SOLUTION) ×1 IMPLANT
SOLUTION PREP PVP 2OZ (MISCELLANEOUS) IMPLANT
SPONGE T-LAP 18X18 ~~LOC~~+RFID (SPONGE) ×1 IMPLANT
SUT VIC AB 3-0 SH 27X BRD (SUTURE) IMPLANT
SUTURE EHLN 3-0 FS-10 30 BLK (SUTURE) IMPLANT
SUTURE MNCRL 4-0 27XMF (SUTURE) IMPLANT
SWAB CULTURE AMIES ANAERIB BLU (MISCELLANEOUS) IMPLANT
SYR 10ML LL (SYRINGE) IMPLANT
TRAP FLUID SMOKE EVACUATOR (MISCELLANEOUS) ×1 IMPLANT
WATER STERILE IRR 500ML POUR (IV SOLUTION) ×1 IMPLANT

## 2023-11-14 NOTE — Op Note (Signed)
 Operative Note  Preoperative diagnosis: Right breast abscess Postoperative diagnosis: Right breast abscess Surgeon: Jayson Hobby, MD EBL: 5 cc Procedure: Incision and drainage of right breast abscess Specimen: Culture of abscess fluid  After informed consent was obtained the patient was brought to the operating room and placed supine on the operating room table.  General endotracheal anesthesia was then induced and her right breast was then prepped and draped in the usual sterile fashion.  A surgical timeout was called identifying correct patient, site, side and procedure.  Using a 22-gauge needle I aspirated approximately 1 cc of pus to ensure that we were in the area of the abscess.  An incision was then made radially from the nipple over the area where aspirated pus.  This was taken down into the abscess cavity with immediate expulsion of approximately 15 cc of pus.  Cultures were taken of the abscess fluid.  The abscess cavity was then broken up of all loculations bluntly.  The abscess cavity was then irrigated with warm saline solution.  It was then packed with Kerlix gauze after hemostasis was obtained.  The skin around the abscess cavity was then infiltrated with 10 cc of half percent Marcaine  solution.  The patient was then awoken from general endotracheal anesthesia and taken to the PACU in good condition.  Prior to termination of the procedure all sponge and instrument counts were correct x 2.
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
## 2023-11-14 NOTE — Transfer of Care (Signed)
 Immediate Anesthesia Transfer of Care Note  Patient: Christie Green  Procedure(s) Performed: INCISION AND DRAINAGE, ABSCESS, BREAST (Right: Breast)  Patient Location: PACU  Anesthesia Type:General  Level of Consciousness: drowsy  Airway & Oxygen Therapy: Patient Spontanous Breathing and Patient connected to face mask oxygen  Post-op Assessment: Report given to RN, Post -op Vital signs reviewed and stable, and Patient moving all extremities X 4  Post vital signs: Reviewed and stable  Last Vitals:  Vitals Value Taken Time  BP 133/71 11/14/23 08:04  Temp 36.6 C 11/14/23 08:04  Pulse 102 11/14/23 08:04  Resp 34 11/14/23 08:04  SpO2 100 % 11/14/23 08:04  Vitals shown include unfiled device data.  Last Pain:  Vitals:   11/14/23 0804  TempSrc:   PainSc: Asleep         Complications: No notable events documented.
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
## 2023-11-14 NOTE — Anesthesia Postprocedure Evaluation (Signed)
 Anesthesia Post Note  Patient: Christie Green  Procedure(s) Performed: INCISION AND DRAINAGE, ABSCESS, BREAST (Right: Breast)  Patient location during evaluation: PACU Anesthesia Type: General Level of consciousness: awake and awake and alert Pain management: satisfactory to patient Vital Signs Assessment: post-procedure vital signs reviewed and stable Respiratory status: spontaneous breathing Cardiovascular status: stable Anesthetic complications: no   No notable events documented.   Last Vitals:  Vitals:   11/14/23 0845 11/14/23 0908  BP: 100/65 (!) 118/59  Pulse: 99 73  Resp: 15 18  Temp:    SpO2: 97% 99%    Last Pain:  Vitals:   11/14/23 0908  TempSrc:   PainSc: 8                  VAN STAVEREN,Kaya Pottenger

## 2023-11-14 NOTE — Anesthesia Procedure Notes (Signed)
 Procedure Name: LMA Insertion Date/Time: 11/14/2023 7:37 AM  Performed by: Myra Lawless, CRNAPre-anesthesia Checklist: Patient identified, Patient being monitored, Timeout performed, Emergency Drugs available and Suction available Patient Re-evaluated:Patient Re-evaluated prior to induction Oxygen Delivery Method: Circle system utilized Preoxygenation: Pre-oxygenation with 100% oxygen Induction Type: IV induction Ventilation: Mask ventilation without difficulty LMA: LMA inserted LMA Size: 5.0 Tube type: Oral Number of attempts: 1 Placement Confirmation: positive ETCO2 and breath sounds checked- equal and bilateral Tube secured with: Tape Dental Injury: Teeth and Oropharynx as per pre-operative assessment

## 2023-11-15 ENCOUNTER — Encounter: Payer: Self-pay | Admitting: General Surgery

## 2023-11-15 ENCOUNTER — Telehealth

## 2023-11-18 LAB — AEROBIC/ANAEROBIC CULTURE W GRAM STAIN (SURGICAL/DEEP WOUND)

## 2023-11-26 ENCOUNTER — Ambulatory Visit

## 2023-11-27 ENCOUNTER — Encounter: Admitting: General Surgery

## 2023-12-03 ENCOUNTER — Ambulatory Visit: Admitting: Obstetrics & Gynecology

## 2023-12-06 ENCOUNTER — Other Ambulatory Visit: Payer: Self-pay

## 2023-12-06 ENCOUNTER — Emergency Department
Admission: EM | Admit: 2023-12-06 | Discharge: 2023-12-07 | Disposition: A | Discharge status: Home / Self Care | Attending: Emergency Medicine | Admitting: Emergency Medicine

## 2023-12-06 DIAGNOSIS — S20151A Superficial foreign body of breast, right breast, initial encounter: Secondary | ICD-10-CM | POA: Diagnosis present

## 2023-12-06 DIAGNOSIS — F17200 Nicotine dependence, unspecified, uncomplicated: Secondary | ICD-10-CM | POA: Insufficient documentation

## 2023-12-06 DIAGNOSIS — X58XXXA Exposure to other specified factors, initial encounter: Secondary | ICD-10-CM | POA: Diagnosis not present

## 2023-12-06 DIAGNOSIS — Z4800 Encounter for change or removal of nonsurgical wound dressing: Secondary | ICD-10-CM

## 2023-12-06 LAB — COMPREHENSIVE METABOLIC PANEL WITH GFR
ALT: 24 U/L (ref 0–44)
AST: 17 U/L (ref 15–41)
Albumin: 3.8 g/dL (ref 3.5–5.0)
Alkaline Phosphatase: 137 U/L — ABNORMAL HIGH (ref 38–126)
Anion gap: 9 (ref 5–15)
BUN: 12 mg/dL (ref 6–20)
CO2: 24 mmol/L (ref 22–32)
Calcium: 9.1 mg/dL (ref 8.9–10.3)
Chloride: 106 mmol/L (ref 98–111)
Creatinine, Ser: 0.78 mg/dL (ref 0.44–1.00)
GFR, Estimated: >60 mL/min (ref >60)
Glucose, Bld: 95 mg/dL (ref 70–99)
Potassium: 4.1 mmol/L (ref 3.5–5.1)
Sodium: 139 mmol/L (ref 135–145)
Total Bilirubin: <0.2 mg/dL (ref 0.0–1.2)
Total Protein: 7.3 g/dL (ref 6.5–8.1)

## 2023-12-06 LAB — CBC WITH DIFFERENTIAL/PLATELET
Abs Immature Granulocytes: 0.02 K/uL (ref 0.00–0.07)
Basophils Absolute: 0 K/uL (ref 0.0–0.1)
Basophils Relative: 0 %
Eosinophils Absolute: 0.3 K/uL (ref 0.0–0.5)
Eosinophils Relative: 3 %
HCT: 36.7 % (ref 36.0–46.0)
Hemoglobin: 11.5 g/dL — ABNORMAL LOW (ref 12.0–15.0)
Immature Granulocytes: 0 %
Lymphocytes Relative: 37 %
Lymphs Abs: 3.6 K/uL (ref 0.7–4.0)
MCH: 27.7 pg (ref 26.0–34.0)
MCHC: 31.3 g/dL (ref 30.0–36.0)
MCV: 88.4 fL (ref 80.0–100.0)
Monocytes Absolute: 0.6 K/uL (ref 0.1–1.0)
Monocytes Relative: 6 %
Neutro Abs: 5.2 K/uL (ref 1.7–7.7)
Neutrophils Relative %: 54 %
Platelets: 258 K/uL (ref 150–400)
RBC: 4.15 MIL/uL (ref 3.87–5.11)
RDW: 16.4 % — ABNORMAL HIGH (ref 11.5–15.5)
WBC: 9.7 K/uL (ref 4.0–10.5)
nRBC: 0 % (ref 0.0–0.2)
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
## 2023-12-06 NOTE — ED Triage Notes (Addendum)
 Pt presents via POV c/o possible infection to right breast. Reports had recent I&D with sterile packing. Reports packing is stuck and has a foul odor to right breast. Reports on abx x2 for same.

## 2023-12-07 LAB — LACTIC ACID, PLASMA: Lactic Acid, Venous: 0.8 mmol/L (ref 0.5–1.9)

## 2023-12-07 MED ORDER — CEPHALEXIN 500 MG PO CAPS: 500.0000 mg | ORAL_CAPSULE | Freq: Four times a day (QID) | ORAL | 0 refills | Status: AC

## 2023-12-07 MED ORDER — DOXYCYCLINE HYCLATE 100 MG PO TABS
100.0000 mg | ORAL_TABLET | Freq: Once | ORAL | Status: DC
  Administered 2023-12-07: 100 mg via ORAL
  Filled 2023-12-07: qty 1

## 2023-12-07 MED ORDER — DOXYCYCLINE HYCLATE 100 MG PO TABS: 100.0000 mg | ORAL_TABLET | Freq: Once | ORAL | Status: DC

## 2023-12-07 MED ORDER — MIDAZOLAM HCL 5 MG/5ML IJ SOLN
2.0000 mg | Freq: Once | INTRAMUSCULAR | Status: AC
  Administered 2023-12-07: 2 mg via INTRAMUSCULAR
  Filled 2023-12-07: qty 5

## 2023-12-07 MED ORDER — CEPHALEXIN 500 MG PO CAPS
500.0000 mg | ORAL_CAPSULE | Freq: Once | ORAL | Status: DC
  Administered 2023-12-07: 500 mg via ORAL
  Filled 2023-12-07: qty 1

## 2023-12-07 MED ORDER — CEPHALEXIN 500 MG PO CAPS: 500.0000 mg | ORAL_CAPSULE | Freq: Once | ORAL | Status: DC

## 2023-12-07 MED ORDER — DOXYCYCLINE HYCLATE 100 MG PO TABS: 100.0000 mg | ORAL_TABLET | Freq: Two times a day (BID) | ORAL | 0 refills | Status: AC

## 2023-12-07 MED ORDER — BUPIVACAINE HCL (PF) 0.5 % IJ SOLN
10.0000 mL | Freq: Once | INTRAMUSCULAR | Status: AC
  Administered 2023-12-07: 10 mL
  Filled 2023-12-07: qty 10

## 2023-12-07 MED ORDER — LIDOCAINE HCL (PF) 1 % IJ SOLN
5.0000 mL | Freq: Once | INTRAMUSCULAR | Status: AC
  Administered 2023-12-07: 5 mL
  Filled 2023-12-07: qty 5

## 2023-12-07 NOTE — Discharge Instructions (Addendum)
 Have been diagnosed with surgical foreign body in right breast.  Please take Keflex  1 capsule by mouth every 6 hours.  Please take doxycycline  1 tablet every 12 hours after main meals.  Please come back to ED or go to your PCP if you have new symptoms symptoms worsen.  Please go to your appointment with Dr. Marinda on December 4.

## 2023-12-07 NOTE — ED Notes (Signed)
 D/c instructions reviewed w/ pt. Pt verbalized understanding.

## 2023-12-11 ENCOUNTER — Encounter: Admitting: General Surgery

## 2024-01-14 ENCOUNTER — Other Ambulatory Visit: Payer: Self-pay

## 2024-01-14 ENCOUNTER — Encounter: Payer: Self-pay | Admitting: Emergency Medicine

## 2024-01-14 ENCOUNTER — Emergency Department
Admission: EM | Admit: 2024-01-14 | Discharge: 2024-01-14 | Disposition: A | Discharge status: Home / Self Care | Attending: Emergency Medicine | Admitting: Emergency Medicine

## 2024-01-14 DIAGNOSIS — H9201 Otalgia, right ear: Secondary | ICD-10-CM | POA: Insufficient documentation

## 2024-01-14 MED ORDER — PSEUDOEPHEDRINE HCL 30 MG PO TABS: 30.0000 mg | ORAL_TABLET | Freq: Four times a day (QID) | ORAL | 2 refills | Status: AC | PRN

## 2024-01-14 NOTE — Discharge Instructions (Addendum)
 Follow up with your PCP  Pain control:  Ibuprofen  (motrin /aleve/advil ) - You can take 3 tablets (600 mg) every 6 hours as needed for pain/fever.  Acetaminophen  (tylenol ) - You can take 2 extra strength tablets (1000 mg) every 6 hours as needed for pain/fever.  You can alternate these medications or take them together.  Make sure you eat food/drink water when taking these medications.  EAR PAIN  Use a saline nasal spray or decongestant to reduce nasal swelling and pressure. Keep the ear dry and avoid swimming or submerging the ear in water. Apply a warm compress to the affected ear for 15-20 minutes. Avoid inserting anything into the ear, including cotton swabs .

## 2024-01-14 NOTE — ED Triage Notes (Signed)
 Pt reports left ear pain. Reports she has been sick recently.

## 2024-01-14 NOTE — ED Provider Notes (Signed)
 "   [facility] Emergency Department Provider Note     Event Date/Time   First MD Initiated Contact with Patient 01/14/24 1802     (approximate)   History   Otalgia   HPI  Christie Green is a 29 y.o. female presents to the ED for evaluation of left ear pain ongoing for 1 week.  Patient reports she recently is getting over a cold and ever since her ears have been painful with coughing.  She also reports muffled sounding. No fever or chills.      Physical Exam   Triage Vital Signs: ED Triage Vitals  Encounter Vitals Group     BP 01/14/24 1650 123/60     Girls Systolic BP Percentile --      Girls Diastolic BP Percentile --      Boys Systolic BP Percentile --      Boys Diastolic BP Percentile --      Pulse Rate 01/14/24 1650 80     Resp 01/14/24 1650 17     Temp 01/14/24 1650 98.2 F (36.8 C)     Temp Source 01/14/24 1650 Oral     SpO2 01/14/24 1650 99 %     Weight --      Height --      Head Circumference --      Peak Flow --      Pain Score 01/14/24 1649 10     Pain Loc --      Pain Education --      Exclude from Growth Chart --     Most recent vital signs: Vitals:   01/14/24 1650  BP: 123/60  Pulse: 80  Resp: 17  Temp: 98.2 F (36.8 C)  SpO2: 99%    General Awake, no distress.  HEENT NCAT. CV:  Good peripheral perfusion.  RESP:  Normal effort.  ABD:  No distention.  Other:  No deformity to bilateral ears.  EACs are patent bilaterally.  TMs visualized bilaterally without erythema, bulging or discharge.    ED Results / Procedures / Treatments   Labs (all labs ordered are listed, but only abnormal results are displayed) Labs Reviewed - No data to display  No results found.  PROCEDURES:  Critical Care performed: No  Procedures   MEDICATIONS ORDERED IN ED: Medications - No data to display   IMPRESSION / MDM / ASSESSMENT AND PLAN / ED COURSE  I reviewed the triage vital signs and the nursing notes.                                29 y.o. female presents to the emergency department for evaluation and treatment of left ear pain. See HPI for further details.   Differential diagnosis includes, but is not limited to otitis media, otitis externa, viral URI, rhinitis  Patient's presentation is most consistent with acute, uncomplicated illness.  Patient is alert and oriented.  She is hemodynamic stable.  Physical exam findings are stated above.  Symptomatic care treatment briefly discussed with patient.  Referred to ENT if symptoms do not improve.  Patient stable condition for discharge home.  ED return precaution discussed.   FINAL CLINICAL IMPRESSION(S) / ED DIAGNOSES   Final diagnoses:  Right ear pain   Rx / DC Orders   ED Discharge Orders          Ordered    pseudoephedrine  (SUDAFED) 30 MG tablet  Every 6  hours PRN        01/14/24 1809           Note:  This document was prepared using Dragon voice recognition software and may include unintentional dictation errors.    Margrette, Tarsha Blando A, PA-C 01/14/24 1813    Viviann Pastor, MD 01/14/24 2300  "

## 2024-01-29 ENCOUNTER — Emergency Department

## 2024-01-29 ENCOUNTER — Other Ambulatory Visit: Payer: Self-pay

## 2024-01-29 ENCOUNTER — Encounter: Payer: Self-pay | Admitting: Emergency Medicine

## 2024-01-29 ENCOUNTER — Emergency Department
Admission: EM | Admit: 2024-01-29 | Discharge: 2024-01-29 | Disposition: A | Discharge status: Home / Self Care | Attending: Emergency Medicine | Admitting: Emergency Medicine

## 2024-01-29 DIAGNOSIS — R079 Chest pain, unspecified: Secondary | ICD-10-CM

## 2024-01-29 DIAGNOSIS — K219 Gastro-esophageal reflux disease without esophagitis: Secondary | ICD-10-CM | POA: Diagnosis not present

## 2024-01-29 LAB — BASIC METABOLIC PANEL WITH GFR
Anion gap: 10 (ref 5–15)
BUN: 12 mg/dL (ref 6–20)
CO2: 22 mmol/L (ref 22–32)
Calcium: 8.9 mg/dL (ref 8.9–10.3)
Chloride: 107 mmol/L (ref 98–111)
Creatinine, Ser: 0.63 mg/dL (ref 0.44–1.00)
GFR, Estimated: >60 mL/min
Glucose, Bld: 89 mg/dL (ref 70–99)
Potassium: 4.3 mmol/L (ref 3.5–5.1)
Sodium: 138 mmol/L (ref 135–145)

## 2024-01-29 LAB — CBC
HCT: 36.1 % (ref 36.0–46.0)
Hemoglobin: 11.4 g/dL — ABNORMAL LOW (ref 12.0–15.0)
MCH: 28.5 pg (ref 26.0–34.0)
MCHC: 31.6 g/dL (ref 30.0–36.0)
MCV: 90.3 fL (ref 80.0–100.0)
Platelets: 224 K/uL (ref 150–400)
RBC: 4 MIL/uL (ref 3.87–5.11)
RDW: 16.1 % — ABNORMAL HIGH (ref 11.5–15.5)
WBC: 9.8 K/uL (ref 4.0–10.5)
nRBC: 0 % (ref 0.0–0.2)

## 2024-01-29 LAB — D-DIMER, QUANTITATIVE: D-Dimer, Quant: 0.3 ug{FEU}/mL (ref 0.00–0.50)

## 2024-01-29 LAB — POC URINE PREG, ED: Preg Test, Ur: NEGATIVE

## 2024-01-29 LAB — TROPONIN T, HIGH SENSITIVITY: Troponin T High Sensitivity: <6 ng/L (ref 0–19)

## 2024-01-29 MED ORDER — FAMOTIDINE 20 MG PO TABS: 20.0000 mg | ORAL_TABLET | Freq: Two times a day (BID) | ORAL | 1 refills | Status: AC

## 2024-01-29 MED ORDER — ALUM & MAG HYDROXIDE-SIMETH 200-200-20 MG/5ML PO SUSP
30.0000 mL | Freq: Once | ORAL | Status: AC
  Administered 2024-01-29: 30 mL via ORAL
  Filled 2024-01-29: qty 30

## 2024-01-29 MED ORDER — LIDOCAINE VISCOUS HCL 2 % MT SOLN
15.0000 mL | Freq: Once | OROMUCOSAL | Status: AC
  Administered 2024-01-29: 15 mL via ORAL
  Filled 2024-01-29: qty 15

## 2024-01-29 MED ORDER — PANTOPRAZOLE SODIUM 40 MG PO TBEC
40.0000 mg | DELAYED_RELEASE_TABLET | Freq: Once | ORAL | Status: AC
  Administered 2024-01-29: 40 mg via ORAL
  Filled 2024-01-29: qty 1

## 2024-01-29 NOTE — ED Notes (Signed)
 MD at bedside, speaking to patient about dc plans

## 2024-01-29 NOTE — ED Provider Notes (Signed)
 [facility] Provider Note    Event Date/Time   First MD Initiated Contact with Patient 01/29/24 [phone]     (approximate)   History   Chest Pain   HPI  Christie Green is a 29 y.o. female with history of depression, presenting with chest pain.  States is midsternal, sharp.  Also notes epigastric abdominal pain.  States symptoms have been ongoing for a day and a half.  Denies any shortness of breath or cough, no nausea vomiting diarrhea, no leg swelling.  She denies prior cardiac history.  Denies any recent travel.  States that she did have surgery to remove an abscess to her right breast several months ago.  Denies unilateral calf swelling or tenderness.  She denies prior cardiac history.  On independent chart review, she was seen by surgery in November, was there for right breast abscess.  Had an I&D done with surgery on 7 November last year.   Physical Exam   Triage Vital Signs: ED Triage Vitals  Encounter Vitals Group     BP 01/29/24 0211 128/77     Girls Systolic BP Percentile --      Girls Diastolic BP Percentile --      Boys Systolic BP Percentile --      Boys Diastolic BP Percentile --      Pulse Rate 01/29/24 0211 71     Resp 01/29/24 0211 18     Temp 01/29/24 0211 99 F (37.2 C)     Temp src --      SpO2 01/29/24 0211 99 %     Weight 01/29/24 0209 220 lb (99.8 kg)     Height 01/29/24 0209 5' 6 (1.676 m)     Head Circumference --      Peak Flow --      Pain Score --      Pain Loc --      Pain Education --      Exclude from Growth Chart --     Most recent vital signs: Vitals:   01/29/24 0235 01/29/24 0300  BP:  110/61  Pulse:    Resp:  17  Temp:    SpO2: 100%      General: Awake, no distress.  CV:  Good peripheral perfusion.  Resp:  Normal effort.  No tachypnea or respiratory distress Abd:  No distention.  Soft nontender Other:  No unilateral calf swelling or tenderness   ED Results / Procedures / Treatments   Labs (all  labs ordered are listed, but only abnormal results are displayed) Labs Reviewed  CBC - Abnormal; Notable for the following components:      Result Value   Hemoglobin 11.4 (*)    RDW 16.1 (*)    All other components within normal limits  D-DIMER, QUANTITATIVE (NOT AT [facility])  BASIC METABOLIC PANEL WITH GFR  POC URINE PREG, ED  TROPONIN T, HIGH SENSITIVITY     EKG  EKG shows, sinus rhythm rate of 66, normal QS, normal QTc, no obvious ischemic ST elevation, not significantly changed compared to prior   RADIOLOGY On my independent interpretation, x-ray without obvious consolidation   PROCEDURES:  Critical Care performed: No  Procedures   MEDICATIONS ORDERED IN ED: Medications  alum & mag hydroxide-simeth (MAALOX/MYLANTA) 200-200-20 MG/5ML suspension 30 mL (30 mLs Oral Given 01/29/24 0253)    And  lidocaine  (XYLOCAINE ) 2 % viscous mouth solution 15 mL (15 mLs Oral Given 01/29/24 0253)  pantoprazole  (PROTONIX ) EC tablet  40 mg (40 mg Oral Given 01/29/24 0252)     IMPRESSION / MDM / ASSESSMENT AND PLAN / ED COURSE  I reviewed the triage vital signs and the nursing notes.                              Differential diagnosis includes, but is not limited to, angina, ACS, GERD, acid reflux, musculoskeletal pain, strain, did consider PE but she is not short of breath, not tachypneic, tachycardic or hypoxic.  Did have recent surgery in November.  Will get a D-dimer, labs, EKG, troponin, chest x-ray, GI cocktail.  Reassess.  Patient's presentation is most consistent with acute presentation with potential threat to life or bodily function.  Independent interpretation of labs and imaging below.  On reassessment patient is feeling significantly better.  Did discuss with her about imaging and lab results.  Will place a primary care doctor referral for her.  Will prescribe her some Pepcid  for acid reflux.  Considered but no indication for inpatient mission at this time, she is safe for  outpatient management.  Will discharge with strict return precautions.    Clinical Course as of 01/29/24 0334  Thu Jan 29, 2024  0330 Independent review of labs, electrolytes not severely deranged, troponins not elevated, D-dimer is not elevated, pregnancy test is negative, no leukocytosis. [TT]    Clinical Course User Index [TT] Waymond Lorelle Cummins, MD     FINAL CLINICAL IMPRESSION(S) / ED DIAGNOSES   Final diagnoses:  Chest pain, unspecified type  Gastroesophageal reflux disease, unspecified whether esophagitis present     Rx / DC Orders   ED Discharge Orders          Ordered    Ambulatory Referral to Primary Care (Establish Care)        01/29/24 0332    famotidine  (PEPCID ) 20 MG tablet  2 times daily        01/29/24 9666             Note:  This document was prepared using Dragon voice recognition software and may include unintentional dictation errors.     Waymond Lorelle Cummins, MD 01/29/24 [phone]

## 2024-01-29 NOTE — Discharge Instructions (Signed)
 Please take the Pepcid  as prescribed for your acid reflux.  I put in a primary care doctor referral for you, please be sure to follow-up.

## 2024-01-29 NOTE — ED Triage Notes (Signed)
 Pt presents to the ED via POV with complaints of mid-sternal stabbing CP that started yesterday. She notes the pain radiates into her stomach. No pain meds taken PTA. Rates the pain 5/10. A&Ox4 at this time. Denies SOB.
